# Patient Record
Sex: Female | Born: 1985
Health system: Southern US, Community
[De-identification: ages and names within clinical notes are randomized; demographics above are authoritative.]

## PROBLEM LIST (undated history)

## (undated) DIAGNOSIS — F329 Major depressive disorder, single episode, unspecified: Secondary | ICD-10-CM

## (undated) DIAGNOSIS — K219 Gastro-esophageal reflux disease without esophagitis: Secondary | ICD-10-CM

## (undated) DIAGNOSIS — E785 Hyperlipidemia, unspecified: Secondary | ICD-10-CM

## (undated) DIAGNOSIS — E669 Obesity, unspecified: Secondary | ICD-10-CM

## (undated) DIAGNOSIS — K589 Irritable bowel syndrome without diarrhea: Secondary | ICD-10-CM

## (undated) DIAGNOSIS — F32A Depression, unspecified: Secondary | ICD-10-CM

## (undated) DIAGNOSIS — D649 Anemia, unspecified: Secondary | ICD-10-CM

## (undated) DIAGNOSIS — F419 Anxiety disorder, unspecified: Secondary | ICD-10-CM

## (undated) DIAGNOSIS — R519 Headache, unspecified: Secondary | ICD-10-CM

## (undated) DIAGNOSIS — J189 Pneumonia, unspecified organism: Secondary | ICD-10-CM

## (undated) DIAGNOSIS — Z8619 Personal history of other infectious and parasitic diseases: Secondary | ICD-10-CM

## (undated) DIAGNOSIS — T7840XA Allergy, unspecified, initial encounter: Secondary | ICD-10-CM

## (undated) HISTORY — PX: HERNIA REPAIR: SHX51

## (undated) HISTORY — DX: Anemia, unspecified: D64.9

## (undated) HISTORY — DX: Personal history of other infectious and parasitic diseases: Z86.19

## (undated) HISTORY — DX: Irritable bowel syndrome, unspecified: K58.9

## (undated) HISTORY — DX: Anxiety disorder, unspecified: F41.9

## (undated) HISTORY — DX: Obesity, unspecified: E66.9

## (undated) HISTORY — DX: Hyperlipidemia, unspecified: E78.5

## (undated) HISTORY — DX: Allergy, unspecified, initial encounter: T78.40XA

## (undated) HISTORY — PX: NO PAST SURGERIES: SHX2092

## (undated) HISTORY — DX: Gastro-esophageal reflux disease without esophagitis: K21.9

---
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
## 2005-04-09 ENCOUNTER — Ambulatory Visit: Payer: Self-pay | Admitting: Family Medicine

## 2005-04-24 ENCOUNTER — Ambulatory Visit: Payer: Self-pay | Admitting: Family Medicine

## 2007-11-01 ENCOUNTER — Encounter: Admission: RE | Admit: 2007-11-01 | Discharge: 2007-11-01 | Payer: Self-pay | Admitting: Gastroenterology

## 2010-08-27 ENCOUNTER — Ambulatory Visit
Admission: RE | Admit: 2010-08-27 | Discharge: 2010-08-27 | Disposition: A | Discharge status: Home / Self Care | Payer: BC Managed Care – PPO | Source: Ambulatory Visit | Attending: Internal Medicine | Admitting: Internal Medicine

## 2010-08-27 ENCOUNTER — Other Ambulatory Visit: Payer: Self-pay | Admitting: Internal Medicine

## 2010-08-27 DIAGNOSIS — R52 Pain, unspecified: Secondary | ICD-10-CM

## 2011-12-09 ENCOUNTER — Encounter (INDEPENDENT_AMBULATORY_CARE_PROVIDER_SITE_OTHER): Payer: Self-pay

## 2011-12-11 ENCOUNTER — Encounter (INDEPENDENT_AMBULATORY_CARE_PROVIDER_SITE_OTHER): Payer: Self-pay | Admitting: Surgery

## 2011-12-11 ENCOUNTER — Ambulatory Visit (INDEPENDENT_AMBULATORY_CARE_PROVIDER_SITE_OTHER): Payer: PRIVATE HEALTH INSURANCE | Admitting: Surgery

## 2011-12-11 VITALS — BP 124/90 | HR 77 | Temp 98.0°F | Ht 66.0 in | Wt 187.8 lb

## 2011-12-11 DIAGNOSIS — E049 Nontoxic goiter, unspecified: Secondary | ICD-10-CM

## 2011-12-11 DIAGNOSIS — E040 Nontoxic diffuse goiter: Secondary | ICD-10-CM

## 2011-12-11 NOTE — Patient Instructions (Signed)
Thyroid Diseases Your thyroid is a butterfly-shaped gland in your neck. It is located just above your collarbone. It is one of your endocrine glands, which make hormones. The thyroid helps set your metabolism. Metabolism is how your body gets energy from the foods you eat.  Millions of people have thyroid diseases. Women experience thyroid problems more often than men. In fact, overactive thyroid problems (hyperthyroidism) occur in 1% of all women. If you have a thyroid disease, your body may use energy more slowly or quickly than it should.  Thyroid problems also include an immune disease where your body reacts against your thyroid gland (called thyroiditis). A different problem involves lumps and bumps (called nodules) that develop in the gland. The nodules are usually, but not always, noncancerous. THE MOST COMMON THYROID PROBLEMS AND CAUSES ARE DISCUSSED BELOW There are many causes for thyroid problems. Treatment depends upon the exact diagnosis and includes trying to reset your body's metabolism to a normal rate. Hyperthyroidism Too much thyroid hormone from an overactive thyroid gland is called hyperthyroidism. In hyperthyroidism, the body's metabolism speeds up. One of the most frequent forms of hyperthyroidism is known as Graves' disease. Graves' disease tends to run in families. Although Graves' is thought to be caused by a problem with the immune system, the exact nature of the genetic problem is unknown. Hypothyroidism Too little thyroid hormone from an underactive thyroid gland is called hypothyroidism. In hypothyroidism, the body's metabolism is slowed. Several things can cause this condition. Most causes affect the thyroid gland directly and hurt its ability to make enough hormone.  Rarely, there may be a pituitary gland tumor (located near the base of the brain). The tumor can block the pituitary from producing thyroid-stimulating hormone (TSH). Your body makes TSH to stimulate the thyroid  to work properly. If the pituitary does not make enough TSH, the thyroid fails to make enough hormones needed for good health. Whether the problem is caused by thyroid conditions or by the pituitary gland, the result is that the thyroid is not making enough hormones. Hypothyroidism causes many physical and mental processes to become sluggish. The body consumes less oxygen and produces less body heat. Thyroid Nodules A thyroid nodule is a small swelling or lump in the thyroid gland. They are common. These nodules represent either a growth of thyroid tissue or a fluid-filled cyst. Both form a lump in the thyroid gland. Almost half of all people will have tiny thyroid nodules at some point in their lives. Typically, these are not noticeable until they become large and affect normal thyroid size. Larger nodules that are greater than a half inch across (about 1 centimeter) occur in about 5 percent of people. Although most nodules are not cancerous, people who have them should seek medical care to rule out cancer. Also, some thyroid nodules may produce too much thyroid hormone or become too large. Large nodules or a large gland can interfere with breathing or swallowing or may cause neck discomfort. Other problems Other thyroid problems include cancer and thyroiditis. Thyroiditis is a malfunction of the body's immune system. Normally, the immune system works to defend the body against infection and other problems. When the immune system is not working properly, it may mistakenly attack normal cells, tissues, and organs. Examples of autoimmune diseases are Hashimoto's thyroiditis (which causes low thyroid function) and Graves' disease (which causes excess thyroid function). SYMPTOMS  Symptoms vary greatly depending upon the exact type of problem with the thyroid. Hyperthyroidism-is when your thyroid is too   active and makes more thyroid hormone than your body needs. The most common cause is Graves' Disease. Too  much thyroid hormone can cause some or all of the following symptoms:  Anxiety.   Irritability.   Difficulty sleeping.   Fatigue.   A rapid or irregular heartbeat.   A fine tremor of your hands or fingers.   An increase in perspiration.   Sensitivity to heat.   Weight loss, despite normal food intake.   Brittle hair.   Enlargement of your thyroid gland (goiter).   Light menstrual periods.   Frequent bowel movements.  Graves' disease can specifically cause eye and skin problems. The skin problems involve reddening and swelling of the skin, often on your shins and on the top of your feet. Eye problems can include the following:  Excess tearing and sensation of grit or sand in either or both eyes.   Reddened or inflamed eyes.   Widening of the space between your eyelids.   Swelling of the lids and tissues around the eyes.   Light sensitivity.   Ulcers on the cornea.   Double vision.   Limited eye movements.   Blurred or reduced vision.  Hypothyroidism- is when your thyroid gland is not active enough. This is more common than hyperthyroidism. Symptoms can vary a lot depending of the severity of the hormone deficiency. Symptoms may develop over a long period of time and can include several of the following:  Fatigue.   Sluggishness.   Increased sensitivity to cold.   Constipation.   Pale, dry skin.   A puffy face.   Hoarse voice.   High blood cholesterol level.   Unexplained weight gain.   Muscle aches, tenderness and stiffness.   Pain, stiffness or swelling in your joints.   Muscle weakness.   Heavier than normal menstrual periods.   Brittle fingernails and hair.   Depression.  Thyroid Nodules - most do not cause signs or symptoms. Occasionally, some may become so large that you can feel or even see the swelling at the base of your neck. You may realize a lump or swelling is there when you are shaving or putting on makeup. Men might become  aware of a nodule when shirt collars suddenly feel too tight. Some nodules produce too much thyroid hormone. This can produce the same symptoms as hyperthyroidism (see above). Thyroid nodules are seldom cancerous. However, a nodule is more likely to be malignant (cancerous) if it:  Grows quickly or feels hard.   Causes you to become hoarse or to have trouble swallowing or breathing.   Causes enlarged lymph nodes under your jaw or in your neck.  DIAGNOSIS  Because there are so many possible thyroid conditions, your caregiver may ask for a number of tests. They will do this in order to narrow down the exact diagnosis. These tests can include:  Blood and antibody tests.   Special thyroid scans using small, safe amounts of radioactive iodine.   Ultrasound of the thyroid gland (particularly if there is a nodule or lump).   Biopsy. This is usually done with a special needle. A needle biopsy is a procedure to obtain a sample of cells from the thyroid. The tissue will be tested in a lab and examined under a microscope.  TREATMENT  Treatment depends on the exact diagnosis. Hyperthyroidism  Beta-blockers help relieve many of the symptoms.   Anti-thyroid medications prevent the thyroid from making excess hormones.   Radioactive iodine treatment can destroy overactive thyroid   cells. The iodine can permanently decrease the amount of hormone produced.   Surgery to remove the thyroid gland.   Treatments for eye problems that come from Graves' disease also include medications and special eye surgery, if felt to be appropriate.  Hypothyroidism Thyroid replacement with levothyroxine is the mainstay of treatment. Treatment with thyroid replacement is usually lifelong and will require monitoring and adjustment from time to time. Thyroid Nodules  Watchful waiting. If a small nodule causes no symptoms or signs of cancer on biopsy, then no treatment may be chosen at first. Re-exam and re-checking blood  tests would be the recommended follow-up.   Anti-thyroid medications or radioactive iodine treatment may be recommended if the nodules produce too much thyroid hormone (see Treatment for Hyperthyroidism above).   Alcohol ablation. Injections of small amounts of ethyl alcohol (ethanol) can cause a non-cancerous nodule to shrink in size.   Surgery (see Treatment for Hyperthyroidism above).  HOME CARE INSTRUCTIONS   Take medications as instructed.   Follow through on recommended testing.  SEEK MEDICAL CARE IF:   You feel that you are developing symptoms of Hyperthyroidism or Hypothyroidism as described above.   You develop a new lump/nodule in the neck/thyroid area that you had not noticed before.   You feel that you are having side effects from medicines prescribed.   You develop trouble breathing or swallowing.  SEEK IMMEDIATE MEDICAL CARE IF:   You develop a fever of 102 F (38.9 C) or higher.   You develop severe sweating.   You develop palpitations and/or rapid heart beat.   You develop shortness of breath.   You develop nausea and vomiting.   You develop extreme shakiness.   You develop agitation.   You develop lightheadedness or have a fainting episode.  Document Released: 02/09/2007 Document Revised: 04/03/2011 Document Reviewed: 02/09/2007 [employer] Patient Information 2012 [employer]. 

## 2011-12-11 NOTE — Progress Notes (Signed)
General Surgery [facility]  Chief Complaint  Patient presents with  . 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 This was initially noted when the patient was in high school. She underwent an ultrasound in 2004 which showed a mildly enlarged thyroid gland without focal lesion.  Patient was evaluated last month by her gynecologist. She was again noted to have a slightly enlarged thyroid gland. Thyroid function tests were obtained and were normal with a TSH level of 1.377. Patient has not had any imaging studies since 2004.  Patient does note a history of goiter in the patient's maternal grandmother.  The patient's mother has hypothyroidism and takes medication. There is no family history of thyroid cancer. There is no family history of other endocrinopathy.  Past Medical History  Diagnosis Date  . Anemia   . GERD (gastroesophageal reflux disease)      Current Outpatient Prescriptions  Medication Sig Dispense Refill  . Multiple Vitamins-Minerals (MULTIVITAMIN WITH MINERALS) tablet Take 1 tablet by mouth daily.         No Known Allergies   Family History  Problem Relation Age of Onset  . Hyperlipidemia Mother   . Hypertension Mother   . Arthritis Mother   . Hyperlipidemia Father   . Hypertension Father   . Cancer Maternal Grandmother     melanoma  . Cancer Paternal Grandmother     liver     History   Social History  . Marital Status: Married    Spouse Name: N/A    Number of Children: N/A  . Years of Education: N/A   Social History Main Topics  . Smoking status: Never Smoker   . Smokeless tobacco: None  . Alcohol Use: Yes     social  . Drug Use: No  . Sexually Active:    Other Topics Concern  . None   Social History Narrative  . None     REVIEW OF SYSTEMS - PERTINENT POSITIVES  ONLY: Denies tremors. Denies palpitations. Denies compressive symptoms. No prior head or neck surgery.  EXAM: Filed Vitals:   12/11/11 1316  BP: 124/90  Pulse: 77  Temp: 98 F (36.7 C)    HEENT: normocephalic; pupils equal and reactive; sclerae clear; dentition good; mucous membranes moist NECK:  Slightly enlarged and mildly firm thyroid gland without palpable dominant or discrete nodule; no significant tenderness; symmetric on extension; no palpable anterior or posterior cervical lymphadenopathy; no supraclavicular masses; no tenderness CHEST: clear to auscultation bilaterally without rales, rhonchi, or wheezes CARDIAC: regular rate and rhythm without significant murmur; peripheral pulses are full EXT:  non-tender without edema; no deformity NEURO: no gross focal deficits; no sign of tremor   LABORATORY RESULTS: See [facility] ([facility]L-Epic) for most recent results   RADIOLOGY RESULTS: See [facility] (CHL-Epic) for most recent results   IMPRESSION: Small diffuse thyroid goiter  PLAN: The patient will be scheduled for thyroid ultrasound. I will review these results and contact her with the report. If there are no focal or worrisome findings, then I think she can simply be followed with annual physical examination and annual TSH levels. She likely has some low-grade thyroiditis and may eventually develop hypothyroidism. At this point there is no role for surgical intervention. I will review her ultrasound and contact her with my final recommendations at that time.  Velora Heckler, MD, FACS General & Endocrine Surgery [facility]   Visit Diagnoses: 1. Goiter diffuse, nontoxic     Primary Care Physician: Tomi Bamberger, NP

## 2011-12-15 ENCOUNTER — Other Ambulatory Visit: Payer: Self-pay

## 2011-12-17 ENCOUNTER — Ambulatory Visit
Admission: RE | Admit: 2011-12-17 | Discharge: 2011-12-17 | Disposition: A | Discharge status: Home / Self Care | Payer: PRIVATE HEALTH INSURANCE | Source: Ambulatory Visit | Attending: Surgery | Admitting: Surgery

## 2011-12-17 DIAGNOSIS — E040 Nontoxic diffuse goiter: Secondary | ICD-10-CM

## 2011-12-18 ENCOUNTER — Other Ambulatory Visit (INDEPENDENT_AMBULATORY_CARE_PROVIDER_SITE_OTHER): Payer: Self-pay

## 2011-12-18 ENCOUNTER — Telehealth (INDEPENDENT_AMBULATORY_CARE_PROVIDER_SITE_OTHER): Payer: Self-pay

## 2011-12-18 NOTE — Telephone Encounter (Signed)
Pt notified of ultrasound result.

## 2011-12-31 ENCOUNTER — Telehealth (INDEPENDENT_AMBULATORY_CARE_PROVIDER_SITE_OTHER): Payer: Self-pay | Admitting: Surgery

## 2011-12-31 DIAGNOSIS — E049 Nontoxic goiter, unspecified: Secondary | ICD-10-CM

## 2011-12-31 DIAGNOSIS — E040 Nontoxic diffuse goiter: Secondary | ICD-10-CM

## 2011-12-31 NOTE — Telephone Encounter (Signed)
Telephone call to the patient with ultrasound results. Thyroid gland is minimally enlarged. Tissue is homogeneous. No nodules. No cystic lesions.  Recommend annual physical examination by her primary care provider. Patient should also have a TSH level checked annually.  Patient will return for surgical evaluation as needed.  Velora Heckler, MD, [facility] Office: [phone]

## 2012-01-13 ENCOUNTER — Encounter (INDEPENDENT_AMBULATORY_CARE_PROVIDER_SITE_OTHER): Payer: Self-pay

## 2012-01-19 LAB — OB RESULTS CONSOLE ANTIBODY SCREEN: Antibody Screen: NEGATIVE

## 2012-01-19 LAB — OB RESULTS CONSOLE GC/CHLAMYDIA: Gonorrhea: NEGATIVE

## 2012-01-19 LAB — OB RESULTS CONSOLE ABO/RH

## 2012-01-19 LAB — OB RESULTS CONSOLE RUBELLA ANTIBODY, IGM: Rubella: IMMUNE

## 2012-08-27 ENCOUNTER — Encounter (HOSPITAL_COMMUNITY): Payer: Self-pay | Admitting: *Deleted

## 2012-08-27 ENCOUNTER — Telehealth (HOSPITAL_COMMUNITY): Payer: Self-pay | Admitting: *Deleted

## 2012-08-27 NOTE — Telephone Encounter (Signed)
Preadmission screen  

## 2012-08-28 ENCOUNTER — Inpatient Hospital Stay (HOSPITAL_COMMUNITY): Admission: AD | Admit: 2012-08-28 | Payer: Self-pay | Source: Ambulatory Visit | Admitting: Obstetrics & Gynecology

## 2012-08-31 ENCOUNTER — Inpatient Hospital Stay (HOSPITAL_COMMUNITY)
Admission: RE | Admit: 2012-08-31 | Discharge: 2012-09-03 | DRG: 775 | Disposition: A | Discharge status: Home / Self Care | Payer: PRIVATE HEALTH INSURANCE | Source: Ambulatory Visit | Attending: Obstetrics and Gynecology | Admitting: Obstetrics and Gynecology

## 2012-08-31 DIAGNOSIS — E040 Nontoxic diffuse goiter: Secondary | ICD-10-CM

## 2012-08-31 LAB — CBC
HCT: 35.7 % — ABNORMAL LOW (ref 36.0–46.0)
MCHC: 34.2 g/dL (ref 30.0–36.0)
MCV: 90.2 fL (ref 78.0–100.0)
RDW: 13.9 % (ref 11.5–15.5)

## 2012-08-31 MED ORDER — CITRIC ACID-SODIUM CITRATE 334-500 MG/5ML PO SOLN: 30.0000 mL | ORAL | Status: DC | PRN

## 2012-08-31 MED ORDER — OXYTOCIN BOLUS FROM INFUSION: 500.0000 mL | INTRAVENOUS | Status: DC

## 2012-08-31 MED ORDER — TERBUTALINE SULFATE 1 MG/ML IJ SOLN: 0.2500 mg | Freq: Once | INTRAMUSCULAR | Status: AC | PRN

## 2012-08-31 MED ORDER — ONDANSETRON HCL 4 MG/2ML IJ SOLN: 4.0000 mg | Freq: Four times a day (QID) | INTRAMUSCULAR | Status: DC | PRN

## 2012-08-31 MED ORDER — IBUPROFEN 600 MG PO TABS: 600.0000 mg | ORAL_TABLET | Freq: Four times a day (QID) | ORAL | Status: DC | PRN

## 2012-08-31 MED ORDER — LACTATED RINGERS IV SOLN
INTRAVENOUS | Status: DC
  Administered 2012-08-31 – 2012-09-01 (×3): via INTRAVENOUS

## 2012-08-31 MED ORDER — OXYTOCIN 40 UNITS IN LACTATED RINGERS INFUSION - SIMPLE MED
62.5000 mL/h | INTRAVENOUS | Status: DC
  Filled 2012-08-31: qty 1000

## 2012-08-31 MED ORDER — ZOLPIDEM TARTRATE 5 MG PO TABS
5.0000 mg | ORAL_TABLET | Freq: Every day | ORAL | Status: DC
  Administered 2012-08-31: 5 mg via ORAL
  Filled 2012-08-31: qty 1

## 2012-08-31 MED ORDER — LACTATED RINGERS IV SOLN: 500.0000 mL | INTRAVENOUS | Status: DC | PRN

## 2012-08-31 MED ORDER — ACETAMINOPHEN 325 MG PO TABS: 650.0000 mg | ORAL_TABLET | ORAL | Status: DC | PRN

## 2012-08-31 MED ORDER — LIDOCAINE HCL (PF) 1 % IJ SOLN
30.0000 mL | INTRAMUSCULAR | Status: DC | PRN
  Filled 2012-08-31 (×2): qty 30

## 2012-08-31 MED ORDER — OXYCODONE-ACETAMINOPHEN 5-325 MG PO TABS: 1.0000 | ORAL_TABLET | ORAL | Status: DC | PRN

## 2012-08-31 MED ORDER — MISOPROSTOL 25 MCG QUARTER TABLET
25.0000 ug | ORAL_TABLET | ORAL | Status: DC | PRN
  Administered 2012-08-31 – 2012-09-01 (×2): 25 ug via VAGINAL
  Filled 2012-08-31: qty 1
  Filled 2012-08-31 (×2): qty 0.25

## 2012-09-01 ENCOUNTER — Encounter (HOSPITAL_COMMUNITY): Payer: Self-pay | Admitting: Anesthesiology

## 2012-09-01 ENCOUNTER — Encounter (HOSPITAL_COMMUNITY): Payer: Self-pay

## 2012-09-01 ENCOUNTER — Inpatient Hospital Stay (HOSPITAL_COMMUNITY): Payer: PRIVATE HEALTH INSURANCE | Admitting: Anesthesiology

## 2012-09-01 MED ORDER — TETANUS-DIPHTH-ACELL PERTUSSIS 5-2.5-18.5 LF-MCG/0.5 IM SUSP: 0.5000 mL | Freq: Once | INTRAMUSCULAR | Status: DC

## 2012-09-01 MED ORDER — IBUPROFEN 600 MG PO TABS
600.0000 mg | ORAL_TABLET | Freq: Four times a day (QID) | ORAL | Status: DC
  Administered 2012-09-01 – 2012-09-03 (×7): 600 mg via ORAL
  Filled 2012-09-01 (×7): qty 1

## 2012-09-01 MED ORDER — PHENYLEPHRINE 40 MCG/ML (10ML) SYRINGE FOR IV PUSH (FOR BLOOD PRESSURE SUPPORT)
80.0000 ug | PREFILLED_SYRINGE | INTRAVENOUS | Status: DC | PRN
  Filled 2012-09-01: qty 2

## 2012-09-01 MED ORDER — FLEET ENEMA 7-19 GM/118ML RE ENEM: 1.0000 | ENEMA | Freq: Every day | RECTAL | Status: DC | PRN

## 2012-09-01 MED ORDER — BUTORPHANOL TARTRATE 1 MG/ML IJ SOLN
1.0000 mg | Freq: Once | INTRAMUSCULAR | Status: AC
  Administered 2012-09-01: 1 mg via INTRAVENOUS
  Filled 2012-09-01: qty 1

## 2012-09-01 MED ORDER — ZOLPIDEM TARTRATE 5 MG PO TABS: 5.0000 mg | ORAL_TABLET | Freq: Every evening | ORAL | Status: DC | PRN

## 2012-09-01 MED ORDER — LIDOCAINE HCL (PF) 1 % IJ SOLN
INTRAMUSCULAR | Status: DC | PRN
  Administered 2012-09-01 (×2): 5 mL

## 2012-09-01 MED ORDER — EPHEDRINE 5 MG/ML INJ
10.0000 mg | INTRAVENOUS | Status: DC | PRN
  Filled 2012-09-01: qty 4
  Filled 2012-09-01: qty 2

## 2012-09-01 MED ORDER — EPHEDRINE 5 MG/ML INJ
10.0000 mg | INTRAVENOUS | Status: DC | PRN
Start: 2012-09-01 — End: 2012-09-01
  Filled 2012-09-01: qty 2

## 2012-09-01 MED ORDER — PROMETHAZINE HCL 25 MG/ML IJ SOLN
12.5000 mg | Freq: Four times a day (QID) | INTRAMUSCULAR | Status: DC | PRN
  Administered 2012-09-01: 12.5 mg via INTRAVENOUS
  Filled 2012-09-01: qty 1

## 2012-09-01 MED ORDER — FENTANYL 2.5 MCG/ML BUPIVACAINE 1/10 % EPIDURAL INFUSION (WH - ANES)
14.0000 mL/h | INTRAMUSCULAR | Status: DC | PRN
  Administered 2012-09-01: 14 mL/h via EPIDURAL
  Filled 2012-09-01: qty 125

## 2012-09-01 MED ORDER — BISACODYL 10 MG RE SUPP: 10.0000 mg | Freq: Every day | RECTAL | Status: DC | PRN

## 2012-09-01 MED ORDER — SENNOSIDES-DOCUSATE SODIUM 8.6-50 MG PO TABS
2.0000 | ORAL_TABLET | Freq: Every day | ORAL | Status: DC
Start: 2012-09-01 — End: 2012-09-03
  Administered 2012-09-01 – 2012-09-02 (×2): 2 via ORAL

## 2012-09-01 MED ORDER — LANOLIN HYDROUS EX OINT: TOPICAL_OINTMENT | CUTANEOUS | Status: DC | PRN

## 2012-09-01 MED ORDER — OXYCODONE-ACETAMINOPHEN 5-325 MG PO TABS: 1.0000 | ORAL_TABLET | ORAL | Status: DC | PRN

## 2012-09-01 MED ORDER — BENZOCAINE-MENTHOL 20-0.5 % EX AERO
1.0000 "application " | INHALATION_SPRAY | CUTANEOUS | Status: DC | PRN
  Administered 2012-09-01: 1 via TOPICAL
  Filled 2012-09-01: qty 56

## 2012-09-01 MED ORDER — DIPHENHYDRAMINE HCL 50 MG/ML IJ SOLN: 12.5000 mg | INTRAMUSCULAR | Status: DC | PRN

## 2012-09-01 MED ORDER — SIMETHICONE 80 MG PO CHEW: 80.0000 mg | CHEWABLE_TABLET | ORAL | Status: DC | PRN

## 2012-09-01 MED ORDER — WITCH HAZEL-GLYCERIN EX PADS: 1.0000 "application " | MEDICATED_PAD | CUTANEOUS | Status: DC | PRN

## 2012-09-01 MED ORDER — ONDANSETRON HCL 4 MG PO TABS: 4.0000 mg | ORAL_TABLET | ORAL | Status: DC | PRN

## 2012-09-01 MED ORDER — DIBUCAINE 1 % RE OINT: 1.0000 "application " | TOPICAL_OINTMENT | RECTAL | Status: DC | PRN

## 2012-09-01 MED ORDER — ONDANSETRON HCL 4 MG/2ML IJ SOLN: 4.0000 mg | INTRAMUSCULAR | Status: DC | PRN

## 2012-09-01 MED ORDER — DIPHENHYDRAMINE HCL 25 MG PO CAPS: 25.0000 mg | ORAL_CAPSULE | Freq: Four times a day (QID) | ORAL | Status: DC | PRN

## 2012-09-01 MED ORDER — PRENATAL MULTIVITAMIN CH
1.0000 | ORAL_TABLET | Freq: Every day | ORAL | Status: DC
  Administered 2012-09-02: 1 via ORAL
  Filled 2012-09-01: qty 1

## 2012-09-01 MED ORDER — PHENYLEPHRINE 40 MCG/ML (10ML) SYRINGE FOR IV PUSH (FOR BLOOD PRESSURE SUPPORT)
80.0000 ug | PREFILLED_SYRINGE | INTRAVENOUS | Status: DC | PRN
  Filled 2012-09-01: qty 5
  Filled 2012-09-01: qty 2

## 2012-09-01 MED ORDER — LACTATED RINGERS IV SOLN
500.0000 mL | Freq: Once | INTRAVENOUS | Status: AC
  Administered 2012-09-01: 500 mL via INTRAVENOUS

## 2012-09-01 MED ORDER — OXYTOCIN 40 UNITS IN LACTATED RINGERS INFUSION - SIMPLE MED
1.0000 m[IU]/min | INTRAVENOUS | Status: DC
  Administered 2012-09-01: 2 m[IU]/min via INTRAVENOUS

## 2012-09-01 NOTE — Anesthesia Preprocedure Evaluation (Signed)
Anesthesia Evaluation  Patient identified by MRN, date of birth, ID band Patient awake    Reviewed: Allergy & Precautions, H&P , Patient's Chart, lab work & pertinent test results  Airway Mallampati: II TM Distance: >3 FB Neck ROM: full    Dental no notable dental hx.    Pulmonary neg pulmonary ROS,  breath sounds clear to auscultation  Pulmonary exam normal       Cardiovascular negative cardio ROS  Rhythm:regular Rate:Normal     Neuro/Psych  Headaches, Anxiety negative neurological ROS  negative psych ROS   GI/Hepatic negative GI ROS, Neg liver ROS, GERD-  ,  Endo/Other  negative endocrine ROS  Renal/GU negative Renal ROS     Musculoskeletal   Abdominal   Peds  Hematology negative hematology ROS (+) anemia ,   Anesthesia Other Findings Anemia     GERD (gastroesophageal reflux disease)        Hx of varicella     History of shingles        Headache     Anxiety        Enlarged thyroid    Reproductive/Obstetrics (+) Pregnancy                           Anesthesia Physical Anesthesia Plan  ASA: II  Anesthesia Plan: Epidural   Post-op Pain Management:    Induction:   Airway Management Planned:   Additional Equipment:   Intra-op Plan:   Post-operative Plan:   Informed Consent: I have reviewed the patients History and Physical, chart, labs and discussed the procedure including the risks, benefits and alternatives for the proposed anesthesia with the patient or authorized representative who has indicated his/her understanding and acceptance.     Plan Discussed with:   Anesthesia Plan Comments:         Anesthesia Quick Evaluation

## 2012-09-01 NOTE — Anesthesia Procedure Notes (Signed)
Epidural Patient location during procedure: OB Start time: 09/01/2012 7:21 AM  Staffing Anesthesiologist: Angus Seller., Harrell Gave. Performed by: anesthesiologist   Preanesthetic Checklist Completed: patient identified, site marked, surgical consent, pre-op evaluation, timeout performed, IV checked, risks and benefits discussed and monitors and equipment checked  Epidural Patient position: sitting Prep: site prepped and draped and DuraPrep Patient monitoring: continuous pulse ox and blood pressure Approach: midline Injection technique: LOR air and LOR saline  Needle:  Needle type: Tuohy  Needle gauge: 17 G Needle length: 9 cm and 9 Needle insertion depth: 5 cm cm Catheter type: closed end flexible Catheter size: 19 Gauge Catheter at skin depth: 10 cm Test dose: negative  Assessment Events: blood not aspirated, injection not painful, no injection resistance, negative IV test and no paresthesia  Additional Notes Patient identified.  Risk benefits discussed including failed block, incomplete pain control, headache, nerve damage, paralysis, blood pressure changes, nausea, vomiting, reactions to medication both toxic or allergic, and postpartum back pain.  Patient expressed understanding and wished to proceed.  All questions were answered.  Sterile technique used throughout procedure and epidural site dressed with sterile barrier dressing. No paresthesia or other complications noted.The patient did not experience any signs of intravascular injection such as tinnitus or metallic taste in mouth nor signs of intrathecal spread such as rapid motor block. Please see nursing notes for vital signs.

## 2012-09-01 NOTE — Progress Notes (Signed)
Delivery Note At 12:17 PM a viable female was delivered via Vaginal, Spontaneous Delivery (Presentation: Left Occiput Anterior).  APGAR: 9, 9; weight .   Placenta status: Intact, Spontaneous.  Cord: 3 vessels with the following complications: None.  Cord pH: pending  Anesthesia: Epidural  Episiotomy: Median Lacerations: second degree posterior ML lac repaired and first degree anterior midline lac not repaired Suture Repair: vicryl rapide Est. Blood Loss (mL): 300  Mom to postpartum.  Baby to nursery-stable.  Robin Little,Robin Little E 09/01/2012, 12:40 PM

## 2012-09-01 NOTE — H&P (Signed)
Robin Little is a 27 y.o. female presenting for IOL last pm. S/P cytotec. SROM for clear fluid about 3:30 am. Now epidural in. Maternal Medical History:  Fetal activity: Perceived fetal activity is normal.      OB History   Grav Para Term Preterm Abortions TAB SAB Ect Mult Living   1              Past Medical History  Diagnosis Date  . Anemia   . GERD (gastroesophageal reflux disease)   . Hx of varicella   . History of shingles   . Headache   . Anxiety   . Enlarged thyroid    Past Surgical History  Procedure Laterality Date  . No past surgeries     Family History: family history includes Alcohol abuse in her paternal grandfather; Arthritis in her mother; Cancer in her maternal grandmother and paternal grandmother; Diabetes in her maternal grandfather; Goiter in her maternal grandmother; Hyperlipidemia in her father and mother; Hypertension in her father and mother; Hypothyroidism in her mother; and Parkinson's disease in her maternal grandfather. Social History:  reports that she has never smoked. She has never used smokeless tobacco. She reports that  drinks alcohol. She reports that she does not use illicit drugs.   Prenatal Transfer Tool  Maternal Diabetes: No Genetic Screening: Normal Maternal Ultrasounds/Referrals: Normal Fetal Ultrasounds or other Referrals:  None Maternal Substance Abuse:  No Significant Maternal Medications:  None Significant Maternal Lab Results:  None Other Comments:  None  Review of Systems  Eyes: Negative for blurred vision.  Gastrointestinal: Negative for abdominal pain.  Neurological: Negative for headaches.    Dilation: 3 Effacement (%): 60 Station: -1 Exam by:: Lucas Mallow, RN Blood pressure 126/113, pulse 74, temperature 98.5 F (36.9 C), temperature source Oral, resp. rate 18, height 5\' 6"  (1.676 m), weight 200 lb (90.719 kg), last menstrual period 11/22/2011. Maternal Exam:  Uterine Assessment: Contraction strength is firm.   Contraction frequency is regular.   Abdomen: Patient reports no abdominal tenderness. Fetal presentation: vertex     Fetal Exam Fetal Monitor Review: Pattern: accelerations present.       Physical Exam  Cardiovascular: Normal rate and regular rhythm.   Respiratory: Effort normal and breath sounds normal.  Neurological: She has normal reflexes.   Cx  3/C/-1/vtx Prenatal labs: ABO, Rh: A/Positive/-- (09/23 0000) Antibody: Negative (09/23 0000) Rubella: Immune (09/23 0000) RPR: NON REACTIVE (05/06 2010)  HBsAg: Negative (09/23 0000)  HIV: Non-reactive (09/23 0000)  GBS: Negative (04/03 0000)   Assessment/Plan: 27 yo G1P0 at 40 4/7 weeks In active labor. Pitocin running D/W patient and husband  Robin Little,Robin Little 09/01/2012, 8:28 AM

## 2012-09-02 LAB — CBC
Platelets: 124 10*3/uL — ABNORMAL LOW (ref 150–400)
RDW: 14 % (ref 11.5–15.5)
WBC: 11.2 10*3/uL — ABNORMAL HIGH (ref 4.0–10.5)

## 2012-09-02 NOTE — Anesthesia Postprocedure Evaluation (Signed)
  Anesthesia Post-op Note  Patient: Robin Little  Procedure(s) Performed: * No procedures listed *  Patient Location: Mother/Baby  Anesthesia Type:Epidural  Level of Consciousness: awake  Airway and Oxygen Therapy: Patient Spontanous Breathing  Post-op Pain: none  Post-op Assessment: Patient's Cardiovascular Status Stable, Respiratory Function Stable, Patent Airway, No signs of Nausea or vomiting, Adequate PO intake, Pain level controlled, No headache, No backache, No residual numbness and No residual motor weakness  Post-op Vital Signs: Reviewed and stable  Complications: No apparent anesthesia complications

## 2012-09-02 NOTE — Progress Notes (Signed)
Post Partum Day 1 Subjective: no complaints and up ad lib  Objective: Blood pressure 96/61, pulse 75, temperature 97.9 F (36.6 C), temperature source Oral, resp. rate 20, height 5\' 6"  (1.676 m), weight 90.719 kg (200 lb), last menstrual period 11/22/2011, SpO2 98.00%, unknown if currently breastfeeding.  Physical Exam:  General: alert, cooperative and appears stated age Lochia: appropriate Uterine Fundus: firm Incision: healing well, no significant drainage, no dehiscence, no significant erythema DVT Evaluation: No evidence of DVT seen on physical exam.   Recent Labs  08/31/12 2010 09/02/12 0630  HGB 12.2 9.1*  HCT 35.7* 27.7*    Assessment/Plan: Plan for discharge tomorrow and Circumcision prior to discharge   LOS: 2 days   Derry Arbogast L 09/02/2012, 7:59 AM

## 2012-09-03 MED ORDER — OXYCODONE-ACETAMINOPHEN 5-325 MG PO TABS: 1.0000 | ORAL_TABLET | ORAL | Status: DC | PRN

## 2012-09-03 MED ORDER — IBUPROFEN 600 MG PO TABS: 600.0000 mg | ORAL_TABLET | Freq: Four times a day (QID) | ORAL | Status: DC

## 2012-09-03 NOTE — Discharge Summary (Signed)
Obstetric Discharge Summary Reason for Admission: induction of labor Prenatal Procedures: ultrasound Intrapartum Procedures: spontaneous vaginal delivery Postpartum Procedures: none Complications-Operative and Postpartum: 2 degree perineal laceration Hemoglobin  Date Value Range Status  09/02/2012 9.1* 12.0 - 15.0 g/dL Final     REPEATED TO VERIFY     DELTA CHECK NOTED     HCT  Date Value Range Status  09/02/2012 27.7* 36.0 - 46.0 % Final    Physical Exam:  General: alert and cooperative Lochia: appropriate Uterine Fundus: firm Incision: perineum intact DVT Evaluation: No evidence of DVT seen on physical exam. Negative Homan's sign. No cords or calf tenderness. No significant calf/ankle edema.  Discharge Diagnoses: Term Pregnancy-delivered  Discharge Information: Date: 09/03/2012 Activity: pelvic rest Diet: routine Medications: PNV, Ibuprofen and Percocet Condition: stable Instructions: refer to practice specific booklet Discharge to: home   Newborn Data: Live born female  Birth Weight: 7 lb 5 oz (3317 g) APGAR: 9, 9  Home with mother.  CURTIS,CAROL G 09/03/2012, 8:34 AM

## 2012-09-03 NOTE — Progress Notes (Signed)
Post discharge chart review completed.  

## 2012-09-03 NOTE — Lactation Note (Signed)
This note was copied from the chart of Robin Grant Henkes. Lactation Consultation Note Baby just finishing up a feeding on the left when I enter room; mom holding baby in football; baby sleeping soundly.  Mom states br feeding is going very well; denies pain. Mom and dad have numerous questions; discussed all their br feeding questions and reassured them that they are getting off to a very good start. Enc mom to call the lactation office for assistance if she has any concerns, and to attend the BFSG. Patient Name: Robin Little [id number] Date: 09/03/2012 Reason for consult: Follow-up assessment   Maternal Data    Feeding Feeding Type: Breast Milk Feeding method: Breast Length of feed: 60 min  LATCH Score/Interventions                      Lactation Tools Discussed/Used     Consult Status Consult Status: Complete    Lenard Forth 09/03/2012, 9:19 AM

## 2012-09-03 NOTE — Progress Notes (Signed)

## 2014-02-27 ENCOUNTER — Encounter (HOSPITAL_COMMUNITY): Payer: Self-pay

## 2014-02-28 LAB — OB RESULTS CONSOLE RUBELLA ANTIBODY, IGM: Rubella: IMMUNE

## 2014-02-28 LAB — OB RESULTS CONSOLE ABO/RH: RH TYPE: POSITIVE

## 2014-02-28 LAB — OB RESULTS CONSOLE HEPATITIS B SURFACE ANTIGEN: HEP B S AG: NEGATIVE

## 2014-02-28 LAB — OB RESULTS CONSOLE GC/CHLAMYDIA
Chlamydia: NEGATIVE
GC PROBE AMP, GENITAL: NEGATIVE

## 2014-02-28 LAB — OB RESULTS CONSOLE RPR: RPR: NONREACTIVE

## 2014-02-28 LAB — OB RESULTS CONSOLE ANTIBODY SCREEN: ANTIBODY SCREEN: NEGATIVE

## 2014-02-28 LAB — OB RESULTS CONSOLE HIV ANTIBODY (ROUTINE TESTING): HIV: NONREACTIVE

## 2014-04-28 ENCOUNTER — Inpatient Hospital Stay (HOSPITAL_COMMUNITY): Admission: AD | Admit: 2014-04-28 | Payer: 59 | Source: Ambulatory Visit | Admitting: Obstetrics & Gynecology

## 2014-04-28 NOTE — L&D Delivery Note (Signed)
Delivery Note At 2:31 PM a viable female was delivered via OP Presentation Apgars 9 9    Placenta status: spontaneously with 3 vessel cord and intact, .  Cord:  with the following complications:none .  Cord pH: not obtained  Anesthesia: Epidural  Episiotomy: None Lacerations: 2nd degree;Perineal Suture Repair: 3.0 chromic Est. Blood Loss (mL):  300  Mom to postpartum.  Baby to Couplet care / Skin to Skin.  Lawsyn Heiler L 10/10/2014, 2:42 PM

## 2014-10-06 ENCOUNTER — Encounter (HOSPITAL_COMMUNITY): Payer: Self-pay | Admitting: *Deleted

## 2014-10-06 ENCOUNTER — Telehealth (HOSPITAL_COMMUNITY): Payer: Self-pay | Admitting: *Deleted

## 2014-10-06 LAB — OB RESULTS CONSOLE GBS: STREP GROUP B AG: NEGATIVE

## 2014-10-06 NOTE — Telephone Encounter (Signed)
Preadmission screen  

## 2014-10-10 ENCOUNTER — Inpatient Hospital Stay (HOSPITAL_COMMUNITY): Payer: 59 | Admitting: Anesthesiology

## 2014-10-10 ENCOUNTER — Inpatient Hospital Stay (HOSPITAL_COMMUNITY)
Admission: RE | Admit: 2014-10-10 | Discharge: 2014-10-12 | DRG: 775 | Disposition: A | Discharge status: Home / Self Care | Payer: 59 | Source: Ambulatory Visit | Attending: Obstetrics and Gynecology | Admitting: Obstetrics and Gynecology

## 2014-10-10 ENCOUNTER — Encounter (HOSPITAL_COMMUNITY): Payer: Self-pay

## 2014-10-10 DIAGNOSIS — O9912 Other diseases of the blood and blood-forming organs and certain disorders involving the immune mechanism complicating childbirth: Secondary | ICD-10-CM | POA: Diagnosis present

## 2014-10-10 DIAGNOSIS — Z3A39 39 weeks gestation of pregnancy: Secondary | ICD-10-CM | POA: Diagnosis present

## 2014-10-10 DIAGNOSIS — Z3490 Encounter for supervision of normal pregnancy, unspecified, unspecified trimester: Secondary | ICD-10-CM

## 2014-10-10 DIAGNOSIS — Z3483 Encounter for supervision of other normal pregnancy, third trimester: Secondary | ICD-10-CM | POA: Diagnosis present

## 2014-10-10 DIAGNOSIS — D696 Thrombocytopenia, unspecified: Secondary | ICD-10-CM | POA: Diagnosis present

## 2014-10-10 LAB — CBC
HCT: 34.7 % — ABNORMAL LOW (ref 36.0–46.0)
HCT: 32.6 % — ABNORMAL LOW (ref 36.0–46.0)
HEMATOCRIT: 34.5 % — AB (ref 36.0–46.0)
HEMOGLOBIN: 11.4 g/dL — AB (ref 12.0–15.0)
Hemoglobin: 11.7 g/dL — ABNORMAL LOW (ref 12.0–15.0)
Hemoglobin: 10.9 g/dL — ABNORMAL LOW (ref 12.0–15.0)
MCH: 31.6 pg (ref 26.0–34.0)
MCH: 31.5 pg (ref 26.0–34.0)
MCH: 31.2 pg (ref 26.0–34.0)
MCHC: 33.7 g/dL (ref 30.0–36.0)
MCHC: 33.4 g/dL (ref 30.0–36.0)
MCHC: 33 g/dL (ref 30.0–36.0)
MCV: 93.8 fL (ref 78.0–100.0)
MCV: 94.2 fL (ref 78.0–100.0)
MCV: 94.5 fL (ref 78.0–100.0)
Platelets: 121 10*3/uL — ABNORMAL LOW (ref 150–400)
Platelets: 99 10*3/uL — ABNORMAL LOW (ref 150–400)
Platelets: 97 10*3/uL — ABNORMAL LOW (ref 150–400)
RBC: 3.7 MIL/uL — ABNORMAL LOW (ref 3.87–5.11)
RBC: 3.46 MIL/uL — ABNORMAL LOW (ref 3.87–5.11)
RBC: 3.65 MIL/uL — ABNORMAL LOW (ref 3.87–5.11)
RDW: 14.1 % (ref 11.5–15.5)
RDW: 14.3 % (ref 11.5–15.5)
RDW: 14.2 % (ref 11.5–15.5)
WBC: 9 10*3/uL (ref 4.0–10.5)
WBC: 9.2 10*3/uL (ref 4.0–10.5)
WBC: 12 10*3/uL — ABNORMAL HIGH (ref 4.0–10.5)

## 2014-10-10 LAB — RPR: RPR Ser Ql: NONREACTIVE

## 2014-10-10 LAB — ABO/RH: ABO/RH(D): A POS

## 2014-10-10 LAB — TYPE AND SCREEN
ABO/RH(D): A POS
Antibody Screen: NEGATIVE

## 2014-10-10 MED ORDER — FLEET ENEMA 7-19 GM/118ML RE ENEM: 1.0000 | ENEMA | RECTAL | Status: DC | PRN

## 2014-10-10 MED ORDER — PRENATAL MULTIVITAMIN CH
1.0000 | ORAL_TABLET | Freq: Every day | ORAL | Status: DC
  Administered 2014-10-11: 1 via ORAL
  Filled 2014-10-10: qty 1

## 2014-10-10 MED ORDER — IBUPROFEN 600 MG PO TABS
600.0000 mg | ORAL_TABLET | Freq: Four times a day (QID) | ORAL | Status: DC
  Administered 2014-10-11 – 2014-10-12 (×4): 600 mg via ORAL
  Filled 2014-10-10 (×4): qty 1

## 2014-10-10 MED ORDER — ONDANSETRON HCL 4 MG/2ML IJ SOLN: 4.0000 mg | INTRAMUSCULAR | Status: DC | PRN

## 2014-10-10 MED ORDER — ONDANSETRON HCL 4 MG PO TABS: 4.0000 mg | ORAL_TABLET | ORAL | Status: DC | PRN

## 2014-10-10 MED ORDER — LIDOCAINE HCL (PF) 1 % IJ SOLN
INTRAMUSCULAR | Status: DC | PRN
  Administered 2014-10-10 (×2): 4 mL

## 2014-10-10 MED ORDER — OXYCODONE-ACETAMINOPHEN 5-325 MG PO TABS
1.0000 | ORAL_TABLET | ORAL | Status: DC | PRN
  Administered 2014-10-10 – 2014-10-11 (×2): 1 via ORAL
  Filled 2014-10-10 (×2): qty 1

## 2014-10-10 MED ORDER — OXYCODONE-ACETAMINOPHEN 5-325 MG PO TABS: 1.0000 | ORAL_TABLET | ORAL | Status: DC | PRN

## 2014-10-10 MED ORDER — WITCH HAZEL-GLYCERIN EX PADS
1.0000 "application " | MEDICATED_PAD | CUTANEOUS | Status: DC | PRN
  Administered 2014-10-10: 1 via TOPICAL

## 2014-10-10 MED ORDER — LACTATED RINGERS IV SOLN: 500.0000 mL | INTRAVENOUS | Status: DC | PRN

## 2014-10-10 MED ORDER — ACETAMINOPHEN 325 MG PO TABS: 650.0000 mg | ORAL_TABLET | ORAL | Status: DC | PRN

## 2014-10-10 MED ORDER — OXYTOCIN 40 UNITS IN LACTATED RINGERS INFUSION - SIMPLE MED
62.5000 mL/h | INTRAVENOUS | Status: DC
  Filled 2014-10-10: qty 1000

## 2014-10-10 MED ORDER — BENZOCAINE-MENTHOL 20-0.5 % EX AERO
1.0000 "application " | INHALATION_SPRAY | CUTANEOUS | Status: DC | PRN
  Administered 2014-10-10: 1 via TOPICAL
  Filled 2014-10-10: qty 56

## 2014-10-10 MED ORDER — TERBUTALINE SULFATE 1 MG/ML IJ SOLN
0.2500 mg | Freq: Once | INTRAMUSCULAR | Status: DC | PRN
  Filled 2014-10-10: qty 1

## 2014-10-10 MED ORDER — SENNOSIDES-DOCUSATE SODIUM 8.6-50 MG PO TABS
2.0000 | ORAL_TABLET | ORAL | Status: DC
  Administered 2014-10-10 – 2014-10-12 (×2): 2 via ORAL
  Filled 2014-10-10 (×2): qty 2

## 2014-10-10 MED ORDER — PHENYLEPHRINE 40 MCG/ML (10ML) SYRINGE FOR IV PUSH (FOR BLOOD PRESSURE SUPPORT)
80.0000 ug | PREFILLED_SYRINGE | INTRAVENOUS | Status: DC | PRN
  Filled 2014-10-10: qty 20
  Filled 2014-10-10: qty 2

## 2014-10-10 MED ORDER — MEASLES, MUMPS & RUBELLA VAC ~~LOC~~ INJ: 0.5000 mL | INJECTION | Freq: Once | SUBCUTANEOUS | Status: DC

## 2014-10-10 MED ORDER — SIMETHICONE 80 MG PO CHEW: 80.0000 mg | CHEWABLE_TABLET | ORAL | Status: DC | PRN

## 2014-10-10 MED ORDER — DIPHENHYDRAMINE HCL 25 MG PO CAPS: 25.0000 mg | ORAL_CAPSULE | Freq: Four times a day (QID) | ORAL | Status: DC | PRN

## 2014-10-10 MED ORDER — OXYCODONE-ACETAMINOPHEN 5-325 MG PO TABS: 2.0000 | ORAL_TABLET | ORAL | Status: DC | PRN

## 2014-10-10 MED ORDER — EPHEDRINE 5 MG/ML INJ
10.0000 mg | INTRAVENOUS | Status: DC | PRN
  Filled 2014-10-10: qty 2

## 2014-10-10 MED ORDER — MEDROXYPROGESTERONE ACETATE 150 MG/ML IM SUSP: 150.0000 mg | INTRAMUSCULAR | Status: DC | PRN

## 2014-10-10 MED ORDER — DIPHENHYDRAMINE HCL 50 MG/ML IJ SOLN: 12.5000 mg | INTRAMUSCULAR | Status: DC | PRN

## 2014-10-10 MED ORDER — LANOLIN HYDROUS EX OINT: TOPICAL_OINTMENT | CUTANEOUS | Status: DC | PRN

## 2014-10-10 MED ORDER — TETANUS-DIPHTH-ACELL PERTUSSIS 5-2.5-18.5 LF-MCG/0.5 IM SUSP: 0.5000 mL | Freq: Once | INTRAMUSCULAR | Status: DC

## 2014-10-10 MED ORDER — FLEET ENEMA 7-19 GM/118ML RE ENEM: 1.0000 | ENEMA | Freq: Every day | RECTAL | Status: DC | PRN

## 2014-10-10 MED ORDER — FENTANYL 2.5 MCG/ML BUPIVACAINE 1/10 % EPIDURAL INFUSION (WH - ANES)
14.0000 mL/h | INTRAMUSCULAR | Status: DC | PRN
  Administered 2014-10-10: 14 mL/h via EPIDURAL
  Filled 2014-10-10: qty 125

## 2014-10-10 MED ORDER — BISACODYL 10 MG RE SUPP: 10.0000 mg | Freq: Every day | RECTAL | Status: DC | PRN

## 2014-10-10 MED ORDER — DIBUCAINE 1 % RE OINT
1.0000 "application " | TOPICAL_OINTMENT | RECTAL | Status: DC | PRN
  Administered 2014-10-10: 1 via RECTAL
  Filled 2014-10-10: qty 28

## 2014-10-10 MED ORDER — OXYTOCIN 40 UNITS IN LACTATED RINGERS INFUSION - SIMPLE MED
1.0000 m[IU]/min | INTRAVENOUS | Status: DC
  Administered 2014-10-10: 2 m[IU]/min via INTRAVENOUS

## 2014-10-10 MED ORDER — CITRIC ACID-SODIUM CITRATE 334-500 MG/5ML PO SOLN: 30.0000 mL | ORAL | Status: DC | PRN

## 2014-10-10 MED ORDER — LACTATED RINGERS IV SOLN
INTRAVENOUS | Status: DC
  Administered 2014-10-10 (×3): via INTRAVENOUS

## 2014-10-10 MED ORDER — OXYTOCIN BOLUS FROM INFUSION: 500.0000 mL | INTRAVENOUS | Status: DC

## 2014-10-10 MED ORDER — ZOLPIDEM TARTRATE 5 MG PO TABS: 5.0000 mg | ORAL_TABLET | Freq: Every evening | ORAL | Status: DC | PRN

## 2014-10-10 MED ORDER — LIDOCAINE HCL (PF) 1 % IJ SOLN
30.0000 mL | INTRAMUSCULAR | Status: DC | PRN
  Filled 2014-10-10: qty 30

## 2014-10-10 MED ORDER — ONDANSETRON HCL 4 MG/2ML IJ SOLN: 4.0000 mg | Freq: Four times a day (QID) | INTRAMUSCULAR | Status: DC | PRN

## 2014-10-10 NOTE — Anesthesia Preprocedure Evaluation (Signed)
Anesthesia Evaluation  Patient identified by MRN, date of birth, ID band Patient awake    Reviewed: Allergy & Precautions, NPO status , Patient's Chart, lab work & pertinent test results  History of Anesthesia Complications (+) history of anesthetic complications  Airway Mallampati: II  TM Distance: >3 FB Neck ROM: Full    Dental no notable dental hx. (+) Dental Advisory Given   Pulmonary neg pulmonary ROS,  breath sounds clear to auscultation  Pulmonary exam normal       Cardiovascular negative cardio ROS Normal cardiovascular examRhythm:Regular Rate:Normal     Neuro/Psych negative neurological ROS  negative psych ROS   GI/Hepatic Neg liver ROS, GERD-  Medicated and Controlled,  Endo/Other  negative endocrine ROSobesity  Renal/GU negative Renal ROS  negative genitourinary   Musculoskeletal negative musculoskeletal ROS (+)   Abdominal   Peds negative pediatric ROS (+)  Hematology Gestational thrombocytopenia. Plts 99 at time of placement. Will obtain a repeat plt count prior to removal of epidural   Anesthesia Other Findings   Reproductive/Obstetrics (+) Pregnancy                             Anesthesia Physical Anesthesia Plan  ASA: II  Anesthesia Plan: Epidural   Post-op Pain Management:    Induction:   Airway Management Planned:   Additional Equipment:   Intra-op Plan:   Post-operative Plan:   Informed Consent: I have reviewed the patients History and Physical, chart, labs and discussed the procedure including the risks, benefits and alternatives for the proposed anesthesia with the patient or authorized representative who has indicated his/her understanding and acceptance.   Dental advisory given  Plan Discussed with: CRNA  Anesthesia Plan Comments:         Anesthesia Quick Evaluation

## 2014-10-10 NOTE — Anesthesia Postprocedure Evaluation (Signed)
Anesthesia Post Note  Patient: Robin Little  Procedure(s) Performed: * No procedures listed *  Anesthesia type: Epidural  Patient location: Mother/Baby  Post pain: Pain level controlled  Post assessment: Post-op Vital signs reviewed  Last Vitals:  Filed Vitals:   10/10/14 1822  BP: 111/64  Pulse: 96  Temp: 36.8 C  Resp: 18    Post vital signs: Reviewed  Level of consciousness:alert  Complications: No apparent anesthesia complications Anesthesia Post Note  Patient: Robin Little  Procedure(s) Performed: * No procedures listed *  Anesthesia type: Epidural  Patient location: Mother/Baby  Post pain: Pain level controlled  Post assessment: Post-op Vital signs reviewed  Last Vitals:  Filed Vitals:   10/10/14 1822  BP: 111/64  Pulse: 96  Temp: 36.8 C  Resp: 18    Post vital signs: Reviewed  Level of consciousness:alert  Complications: No apparent anesthesia complications

## 2014-10-10 NOTE — Anesthesia Procedure Notes (Signed)
Epidural Patient location during procedure: OB  Staffing Anesthesiologist: Blase Beckner Performed by: anesthesiologist   Preanesthetic Checklist Completed: patient identified, site marked, surgical consent, pre-op evaluation, timeout performed, IV checked, risks and benefits discussed and monitors and equipment checked  Epidural Patient position: sitting Prep: site prepped and draped and DuraPrep Patient monitoring: continuous pulse ox and blood pressure Approach: midline Location: L3-L4 Injection technique: LOR saline  Needle:  Needle type: Tuohy  Needle gauge: 17 G Needle length: 9 cm and 9 Needle insertion depth: 6 cm Catheter type: closed end flexible Catheter size: 19 Gauge Catheter at skin depth: 10 cm Test dose: negative  Assessment Events: blood not aspirated, injection not painful, no injection resistance, negative IV test and no paresthesia  Additional Notes Patient identified. Risks/Benefits/Options discussed with patient including but not limited to bleeding, infection, nerve damage, paralysis, failed block, incomplete pain control, headache, blood pressure changes, nausea, vomiting, reactions to medication both or allergic, itching and postpartum back pain. Confirmed with bedside nurse the patient's most recent platelet count. Confirmed with patient that they are not currently taking any anticoagulation, have any bleeding history or any family history of bleeding disorders. Patient expressed understanding and wished to proceed. All questions were answered. Sterile technique was used throughout the entire procedure. Please see nursing notes for vital signs. Test dose was given through epidural catheter and negative prior to continuing to dose epidural or start infusion. Warning signs of high block given to the patient including shortness of breath, tingling/numbness in hands, complete motor block, or any concerning symptoms with instructions to call for help. Patient was  given instructions on fall risk and not to get out of bed. All questions and concerns addressed with instructions to call with any issues or inadequate analgesia.   Addressed with patient concerning symptoms/signs of epidural hematoma. Patient wished to proceed. Plt count 99 at time of placement. Will obtain plt count prior to removal of epidural.

## 2014-10-10 NOTE — Progress Notes (Signed)
After obtaining permission to remove pts epidural when removing dressing RN concerned b/c appeared to be actively bleeding from the site. Req Robin Little come assess pt and remove epidural if she deems appropriate.

## 2014-10-10 NOTE — H&P (Signed)
Robin Little is a 29 y.o. G 2 P 1 at 62 w 3 days presents for induction secondary to favorable cervix and thrombocytopenia.  Platelet  Count 122,000 on admission and now 99,000. Patient admitted last night and started on Pitocin and now status post epidural. History OB History    Gravida Para Term Preterm AB TAB SAB Ectopic Multiple Living   2 1 1       1      Past Medical History  Diagnosis Date  . Anemia   . GERD (gastroesophageal reflux disease)   . Hx of varicella   . History of shingles   . Headache(784.0)   . Anxiety   . Enlarged thyroid   . Hx of thrombocytopenia     has currently   Past Surgical History  Procedure Laterality Date  . No past surgeries     Family History: family history includes Alcohol abuse in her paternal grandfather; Arthritis in her mother; Cancer in her maternal grandmother and paternal grandmother; Diabetes in her maternal grandfather; Goiter in her maternal grandmother; Hyperlipidemia in her father and mother; Hypertension in her father and mother; Hypothyroidism in her mother; Parkinson's disease in her maternal grandfather. Social History:  reports that she has never smoked. She has never used smokeless tobacco. She reports that she drinks alcohol. She reports that she does not use illicit drugs.   Prenatal Transfer Tool  Maternal Diabetes: No Genetic Screening: Normal Maternal Ultrasounds/Referrals: Normal Fetal Ultrasounds or other Referrals:  None Maternal Substance Abuse: no Significant Maternal Medications:  None Significant Maternal Lab Results:  None Other Comments:  None  Review of Systems  All other systems reviewed and are negative.   Dilation: 3.5 Effacement (%): 90 Station: -2 Exam by:: Robin Little Blood pressure 115/72, pulse 81, temperature 97.7 F (36.5 C), temperature source Oral, resp. rate 18, height 5\' 6"  (1.676 m), weight 94.348 kg (208 lb), SpO2 99 %, unknown if currently breastfeeding. Maternal Exam:  Uterine  Assessment: Contraction strength is moderate.  Contraction frequency is regular.   Abdomen: Fetal presentation: vertex     Fetal Exam Fetal State Assessment: Category I - tracings are normal.     Physical Exam  Nursing note and vitals reviewed. Constitutional: She appears well-developed.  HENT:  Head: Normocephalic.  Eyes: Pupils are equal, round, and reactive to light.  Cardiovascular: Normal rate and regular rhythm.   Respiratory: Effort normal.  GI: Soft.    Prenatal labs: ABO, Rh: --/--/A POS, A POS (06/14 0052) Antibody: NEG (06/14 0052) Rubella: Immune (11/03 0000) RPR: Nonreactive (11/03 0000)  HBsAg: Negative (11/03 0000)  HIV: Non-reactive (11/03 0000)  GBS: Negative (06/10 0000)   Assessment/Plan: IUP at 39 w 3 days Induction of labor Thrombocytopenia Continue pitocin Anticipate NSVD Do not remove epidural until CBC obtained and cleared by anesthesia  Robin Little 10/10/2014, 8:58 AM

## 2014-10-11 LAB — CBC
HEMATOCRIT: 30.1 % — AB (ref 36.0–46.0)
HEMOGLOBIN: 10 g/dL — AB (ref 12.0–15.0)
MCH: 31.5 pg (ref 26.0–34.0)
MCHC: 33.2 g/dL (ref 30.0–36.0)
MCV: 95 fL (ref 78.0–100.0)
Platelets: 100 10*3/uL — ABNORMAL LOW (ref 150–400)
RBC: 3.17 MIL/uL — AB (ref 3.87–5.11)
RDW: 14.3 % (ref 11.5–15.5)
WBC: 9.3 10*3/uL (ref 4.0–10.5)

## 2014-10-11 MED ORDER — OXYCODONE-ACETAMINOPHEN 5-325 MG PO TABS: 1.0000 | ORAL_TABLET | ORAL | Status: DC | PRN

## 2014-10-11 MED ORDER — IBUPROFEN 600 MG PO TABS: 600.0000 mg | ORAL_TABLET | Freq: Four times a day (QID) | ORAL | Status: DC

## 2014-10-11 NOTE — Discharge Summary (Signed)
Obstetric Discharge Summary Reason for Admission: induction of labor Prenatal Procedures: ultrasound Intrapartum Procedures: spontaneous vaginal delivery Postpartum Procedures: none Complications-Operative and Postpartum: 2 degree perineal laceration HEMOGLOBIN  Date Value Ref Range Status  10/11/2014 10.0* 12.0 - 15.0 g/dL Final   HCT  Date Value Ref Range Status  10/11/2014 30.1* 36.0 - 46.0 % Final    Physical Exam:  General: alert and cooperative Lochia: appropriate Uterine Fundus: firm Incision: healing well DVT Evaluation: No evidence of DVT seen on physical exam. Negative Homan's sign. No cords or calf tenderness. Calf/Ankle edema is present.  Discharge Diagnoses: Term Pregnancy-delivered  Discharge Information: Date: 10/11/2014 Activity: pelvic rest Diet: routine Medications: PNV, Ibuprofen and Percocet Condition: stable Instructions: refer to practice specific booklet Discharge to: home   Newborn Data: Live born female  Birth Weight: 7 lb 13.2 oz (3550 g) APGAR: 8, 9  Home with mother.  Shavonn Convey G 10/11/2014, 8:37 AM

## 2014-10-11 NOTE — Lactation Note (Addendum)
This note was copied from the chart of Girl Realynn Konja. Lactation Consultation Note  P2.  Ex BF for 11.5 months. Mother has history of mastitis.  Reviewed S&S of mastitis, prevention, cluster feeding,engorgement care. Mother had a blister starting to develop on left nipple that she states has now resolved. Discussed how to achieve a deep latch. Provided mother with comfort gels and encouraged her to call if she needs assistance w/ latching. Mom encouraged to feed baby 8-12 times/24 hours and with feeding cues.  Mom made aware of O/P services, breastfeeding support groups, community resources, and our phone # for post-discharge questions.    Patient Name: Girl Shenise Sudduth XENMM'H Date: 10/11/2014     Maternal Data    Feeding    LATCH Score/Interventions                      Lactation Tools Discussed/Used     Consult Status      Dahlia Byes [facility] 10/11/2014, 10:47 AM

## 2014-10-11 NOTE — Progress Notes (Signed)
MOB was referred for history of depression/anxiety.  Referral is screened out by Clinical Social Worker because none of the following criteria appear to apply: -History of anxiety/depression during this pregnancy, or of post-partum depression. - Diagnosis of anxiety and/or depression within last 3 years or -MOB's symptoms are currently being treated with medication and/or therapy.  Per chart review, MOB has history of anxiety while in college. No recent symptoms documented.   Please contact the Clinical Social Worker if needs arise or upon MOB request.   Loleta Books, LCSW [phone]

## 2014-10-11 NOTE — Progress Notes (Signed)
Post Partum Day 1 Subjective: no complaints, up ad lib, voiding, tolerating PO, + flatus and desires early discharge  Objective: Blood pressure 104/62, pulse 69, temperature 98.5 F (36.9 C), temperature source Oral, resp. rate 18, height 5\' 6"  (1.676 m), weight 208 lb (94.348 kg), SpO2 100 %, unknown if currently breastfeeding.  Physical Exam:  General: alert and cooperative Lochia: appropriate Uterine Fundus: firm Incision: healing well DVT Evaluation: No evidence of DVT seen on physical exam. Negative Homan's sign. No cords or calf tenderness. No significant calf/ankle edema.   Recent Labs  10/10/14 1455 10/11/14 0546  HGB 11.4* 10.0*  HCT 34.5* 30.1*    Assessment/Plan: Discharge home   LOS: 1 day   Robin Little G 10/11/2014, 8:22 AM

## 2014-10-12 NOTE — Progress Notes (Signed)
Post Partum Day 2 Subjective: no complaints, up ad lib, voiding, tolerating PO, + flatus and discharge cancelled yesterday secondary to child at home sick  Objective: Blood pressure 105/70, pulse 70, temperature 98 F (36.7 C), temperature source Oral, resp. rate 20, height 5\' 6"  (1.676 m), weight 208 lb (94.348 kg), SpO2 100 %, unknown if currently breastfeeding.  Physical Exam:  General: alert and cooperative Lochia: appropriate Uterine Fundus: firm Incision: healing well DVT Evaluation: No evidence of DVT seen on physical exam. Negative Homan's sign. No cords or calf tenderness. No significant calf/ankle edema.   Recent Labs  10/10/14 1455 10/11/14 0546  HGB 11.4* 10.0*  HCT 34.5* 30.1*    Assessment/Plan: Discharge home   LOS: 2 days   CURTIS,CAROL G 10/12/2014, 8:33 AM

## 2015-05-01 MED FILL — SERTRALINE HCL 100 MG TAB: 100 | 90 days supply | Qty: 90 | Fill #0

## 2015-07-31 MED FILL — SERTRALINE HCL 100 MG TAB: 100 | 90 days supply | Qty: 90 | Fill #1

## 2015-09-14 DIAGNOSIS — H52229 Regular astigmatism, unspecified eye: Secondary | ICD-10-CM | POA: Diagnosis not present

## 2015-10-11 ENCOUNTER — Other Ambulatory Visit (HOSPITAL_COMMUNITY): Payer: Self-pay | Admitting: Physician Assistant

## 2015-10-11 DIAGNOSIS — J329 Chronic sinusitis, unspecified: Secondary | ICD-10-CM | POA: Diagnosis not present

## 2015-10-11 DIAGNOSIS — J342 Deviated nasal septum: Secondary | ICD-10-CM | POA: Diagnosis not present

## 2015-10-11 DIAGNOSIS — J0101 Acute recurrent maxillary sinusitis: Secondary | ICD-10-CM

## 2015-10-24 ENCOUNTER — Ambulatory Visit (HOSPITAL_COMMUNITY)
Admission: RE | Admit: 2015-10-24 | Discharge: 2015-10-24 | Disposition: A | Discharge status: Home / Self Care | Payer: 59 | Source: Ambulatory Visit | Attending: Physician Assistant | Admitting: Physician Assistant

## 2015-10-24 DIAGNOSIS — J328 Other chronic sinusitis: Secondary | ICD-10-CM | POA: Diagnosis not present

## 2015-10-24 DIAGNOSIS — J0101 Acute recurrent maxillary sinusitis: Secondary | ICD-10-CM

## 2015-10-24 DIAGNOSIS — J329 Chronic sinusitis, unspecified: Secondary | ICD-10-CM | POA: Diagnosis not present

## 2015-10-26 DIAGNOSIS — J329 Chronic sinusitis, unspecified: Secondary | ICD-10-CM | POA: Diagnosis not present

## 2015-10-26 DIAGNOSIS — J342 Deviated nasal septum: Secondary | ICD-10-CM | POA: Diagnosis not present

## 2015-10-29 MED FILL — SERTRALINE HCL 100 MG TAB: 100 | 30 days supply | Qty: 30 | Fill #2

## 2015-10-30 ENCOUNTER — Telehealth: Payer: 59 | Admitting: Family

## 2015-10-30 DIAGNOSIS — B373 Candidiasis of vulva and vagina: Secondary | ICD-10-CM

## 2015-10-30 DIAGNOSIS — B3731 Acute candidiasis of vulva and vagina: Secondary | ICD-10-CM

## 2015-10-30 MED ORDER — FLUCONAZOLE 150 MG PO TABS: 150.0000 mg | ORAL_TABLET | Freq: Once | ORAL | Status: DC

## 2015-10-30 NOTE — Progress Notes (Signed)

## 2015-10-31 MED FILL — FLUCONAZOLE 150 MG TABLET: 150 | 1 days supply | Qty: 1 | Fill #0

## 2015-12-03 MED FILL — SERTRALINE HCL 100 MG TAB: 100 | 30 days supply | Qty: 30 | Fill #3

## 2015-12-07 DIAGNOSIS — Z6827 Body mass index (BMI) 27.0-27.9, adult: Secondary | ICD-10-CM | POA: Diagnosis not present

## 2015-12-07 DIAGNOSIS — Z01419 Encounter for gynecological examination (general) (routine) without abnormal findings: Secondary | ICD-10-CM | POA: Diagnosis not present

## 2015-12-26 ENCOUNTER — Telehealth: Payer: 59 | Admitting: Nurse Practitioner

## 2015-12-26 DIAGNOSIS — B3731 Acute candidiasis of vulva and vagina: Secondary | ICD-10-CM

## 2015-12-26 DIAGNOSIS — B373 Candidiasis of vulva and vagina: Secondary | ICD-10-CM

## 2015-12-26 MED ORDER — FLUCONAZOLE 150 MG PO TABS: 150.0000 mg | ORAL_TABLET | Freq: Once | ORAL | 0 refills | Status: AC

## 2015-12-26 MED FILL — FLUCONAZOLE 150 MG TABLET: 150 | 1 days supply | Qty: 1 | Fill #0

## 2015-12-26 MED FILL — SERTRALINE HCL 100 MG TAB: 100 | 30 days supply | Qty: 30 | Fill #0

## 2015-12-26 NOTE — Progress Notes (Signed)

## 2016-02-06 MED FILL — SERTRALINE HCL 100 MG TAB: 100 | 90 days supply | Qty: 90 | Fill #0

## 2016-03-06 ENCOUNTER — Telehealth: Payer: PRIVATE HEALTH INSURANCE | Admitting: Physician Assistant

## 2016-03-06 DIAGNOSIS — B9689 Other specified bacterial agents as the cause of diseases classified elsewhere: Secondary | ICD-10-CM

## 2016-03-06 DIAGNOSIS — J019 Acute sinusitis, unspecified: Secondary | ICD-10-CM

## 2016-03-06 MED ORDER — AMOXICILLIN-POT CLAVULANATE 875-125 MG PO TABS: 1.0000 | ORAL_TABLET | Freq: Two times a day (BID) | ORAL | 0 refills | Status: DC

## 2016-03-06 MED ORDER — FLUCONAZOLE 150 MG PO TABS: 150.0000 mg | ORAL_TABLET | Freq: Once | ORAL | 0 refills | Status: AC

## 2016-03-06 MED FILL — FLUCONAZOLE 150 MG TABLET: 150 | 1 days supply | Qty: 1 | Fill #0

## 2016-03-06 MED FILL — AMOX-CLAV 875-125 MG TABLET: 875-125 | 7 days supply | Qty: 14 | Fill #0

## 2016-03-06 NOTE — Progress Notes (Signed)
We are sorry that you are not feeling well.  Here is how we plan to help!  Based on what you have shared with me it looks like you have sinusitis.  Sinusitis is inflammation and infection in the sinus cavities of the head.  Based on your presentation I believe you most likely have Acute Bacterial Sinusitis.  This is an infection caused by bacteria and is treated with antibiotics. I have prescribed Augmentin 875mg /125mg  one tablet twice daily with food, for 7 days. You may use an oral decongestant such as Mucinex D or if you have glaucoma or high blood pressure use plain Mucinex. Saline nasal spray help and can safely be used as often as needed for congestion.  If you develop worsening sinus pain, fever or notice severe headache and vision changes, or if symptoms are not better after completion of antibiotic, please schedule an appointment with a health care provider.    I have sent in a single Diflucan to take as directed in case of yeast infection.  Sinus infections are not as easily transmitted as other respiratory infection, however we still recommend that you avoid close contact with loved ones, especially the very young and elderly.  Remember to wash your hands thoroughly throughout the day as this is the number one way to prevent the spread of infection!  Home Care:  Only take medications as instructed by your medical team.  Complete the entire course of an antibiotic.  Do not take these medications with alcohol.  A steam or ultrasonic humidifier can help congestion.  You can place a towel over your head and breathe in the steam from hot water coming from a faucet.  Avoid close contacts especially the very young and the elderly.  Cover your mouth when you cough or sneeze.  Always remember to wash your hands.  Get Help Right Away If:  You develop worsening fever or sinus pain.  You develop a severe head ache or visual changes.  Your symptoms persist after you have completed your  treatment plan.  Make sure you  Understand these instructions.  Will watch your condition.  Will get help right away if you are not doing well or get worse.  Your e-visit answers were reviewed by a board certified advanced clinical practitioner to complete your personal care plan.  Depending on the condition, your plan could have included both over the counter or prescription medications.  If there is a problem please reply  once you have received a response from your provider.  Your safety is important to us.  If you have drug allergies check your prescription carefully.    You can use MyChart to ask questions about today's visit, request a non-urgent call back, or ask for a work or school excuse for 24 hours related to this e-Visit. If it has been greater than 24 hours you will need to follow up with your provider, or enter a new e-Visit to address those concerns.  You will get an e-mail in the next two days asking about your experience.  I hope that your e-visit has been valuable and will speed your recovery. Thank you for using e-visits.

## 2016-03-22 ENCOUNTER — Encounter (HOSPITAL_COMMUNITY): Payer: Self-pay | Admitting: Emergency Medicine

## 2016-03-22 ENCOUNTER — Emergency Department (HOSPITAL_COMMUNITY): Payer: 59

## 2016-03-22 ENCOUNTER — Emergency Department (HOSPITAL_COMMUNITY)
Admission: EM | Admit: 2016-03-22 | Discharge: 2016-03-22 | Disposition: A | Discharge status: Home / Self Care | Payer: 59 | Attending: Emergency Medicine | Admitting: Emergency Medicine

## 2016-03-22 DIAGNOSIS — N838 Other noninflammatory disorders of ovary, fallopian tube and broad ligament: Secondary | ICD-10-CM | POA: Insufficient documentation

## 2016-03-22 DIAGNOSIS — Z79899 Other long term (current) drug therapy: Secondary | ICD-10-CM | POA: Insufficient documentation

## 2016-03-22 DIAGNOSIS — N83201 Unspecified ovarian cyst, right side: Secondary | ICD-10-CM | POA: Diagnosis not present

## 2016-03-22 DIAGNOSIS — N949 Unspecified condition associated with female genital organs and menstrual cycle: Secondary | ICD-10-CM

## 2016-03-22 DIAGNOSIS — R1031 Right lower quadrant pain: Secondary | ICD-10-CM | POA: Diagnosis not present

## 2016-03-22 DIAGNOSIS — R52 Pain, unspecified: Secondary | ICD-10-CM

## 2016-03-22 LAB — URINALYSIS, ROUTINE W REFLEX MICROSCOPIC
BILIRUBIN URINE: NEGATIVE
Glucose, UA: NEGATIVE mg/dL
KETONES UR: NEGATIVE mg/dL
Leukocytes, UA: NEGATIVE
Nitrite: NEGATIVE
PROTEIN: NEGATIVE mg/dL
Specific Gravity, Urine: 1.016 (ref 1.005–1.030)
pH: 6 (ref 5.0–8.0)

## 2016-03-22 LAB — COMPREHENSIVE METABOLIC PANEL
ALBUMIN: 4.1 g/dL (ref 3.5–5.0)
ALK PHOS: 54 U/L (ref 38–126)
ALT: 13 U/L — AB (ref 14–54)
AST: 19 U/L (ref 15–41)
Anion gap: 5 (ref 5–15)
BILIRUBIN TOTAL: 0.7 mg/dL (ref 0.3–1.2)
BUN: 15 mg/dL (ref 6–20)
CALCIUM: 8.2 mg/dL — AB (ref 8.9–10.3)
CO2: 26 mmol/L (ref 22–32)
CREATININE: 0.76 mg/dL (ref 0.44–1.00)
Chloride: 108 mmol/L (ref 101–111)
GFR calc non Af Amer: >60 mL/min (ref >60)
GLUCOSE: 97 mg/dL (ref 65–99)
Potassium: 3.7 mmol/L (ref 3.5–5.1)
SODIUM: 139 mmol/L (ref 135–145)
Total Protein: 7 g/dL (ref 6.5–8.1)

## 2016-03-22 LAB — LIPASE, BLOOD: Lipase: 30 U/L (ref 11–51)

## 2016-03-22 LAB — URINE MICROSCOPIC-ADD ON

## 2016-03-22 LAB — CBC
HCT: 39.8 % (ref 36.0–46.0)
Hemoglobin: 12.9 g/dL (ref 12.0–15.0)
MCH: 30.1 pg (ref 26.0–34.0)
MCHC: 32.4 g/dL (ref 30.0–36.0)
MCV: 93 fL (ref 78.0–100.0)
PLATELETS: 116 10*3/uL — AB (ref 150–400)
RBC: 4.28 MIL/uL (ref 3.87–5.11)
RDW: 13.2 % (ref 11.5–15.5)
WBC: 5.4 10*3/uL (ref 4.0–10.5)

## 2016-03-22 LAB — PREGNANCY, URINE: PREG TEST UR: NEGATIVE

## 2016-03-22 MED ORDER — IBUPROFEN 800 MG PO TABS: 800.0000 mg | ORAL_TABLET | Freq: Three times a day (TID) | ORAL | 0 refills | Status: DC | PRN

## 2016-03-22 NOTE — Discharge Instructions (Signed)
Please follow up with your OBGYN for discussion of today's diagnosis. You will need a repeat ultrasound in 6-12 weeks. Ibuprofen as needed for pain.  Return to ER for new or worsening symptoms, any additional concerns.

## 2016-03-22 NOTE — ED Notes (Signed)
US at bedside

## 2016-03-22 NOTE — ED Provider Notes (Signed)
Care assumed from previous provider PA Upstill. Please see note for further details. Briefly, patient is a 30 y.o. female with waxing-waning RLQ pain. Case discussed, plan agreed u36pon. CT scan pending at shift change. All labs reviewed by me and reassuring. Will follow up scan, repeat abdominal exam and dispo appropriately.   IMPRESSION: No renal or ureteral stone or obstruction. 6 mm complex cystic structure in the right adnexa probably representing hemorrhagic cyst or endometrioma. Ultrasound is suggested for further evaluation. And intrauterine device is present.  CT reviewed and will order ultrasound for further evaluation.   Patient informed of CT results and US. Pain controlled at this time and with no complaints.   IMPRESSION: 1. No evidence of adnexal torsion. 2. Complex 6.8 cm right ovarian cyst most consistent with a large hemorrhagic cyst. Follow-up transvaginal pelvic ultrasound is advised in 6-12 weeks to document resolution, given the large size. This recommendation follows the consensus statement: Management of Asymptomatic Ovarian and Other Adnexal Cysts Imaged at US: Society of Radiologists in Ultrasound Consensus Conference Statement. Radiology 2010; [id number]:943-954. 3. Normal anteverted uterus. No uterine fibroids. IUD appears grossly well-positioned within the endometrial cavity. 4. Small volume free fluid in the pelvis.  Ultrasound reviewed and results discussed with patient. She has an OBGYN who she can follow up with easily. She is aware of the need for repeat ultrasound in 6-12 weeks and agrees to follow up with her OBGYN to do this. Reasons to follow up sooner or return to ER discussed. Patient states pain is manageable with ibuprofen. Rx given. All questions answered.    [facility], LPJaime Pilcher Robin Gravois, PA-C 03/22/16 1025    Robin NickAnthony Allen, MD 03/23/16 [phone]

## 2016-03-22 NOTE — ED Provider Notes (Signed)
[facility]-EMERGENCY DEPT Provider Note   CSN: [id number] Arrival date & time: 03/22/16  0331     History   Chief Complaint Chief Complaint  Patient presents with  . Abdominal Pain    HPI Robin Little is a 30 y.o. female.  Patient presents for evaluation of RLQ abdominal pain that woke her from sleep around 2:00 am. She reports the pain was "excrutiating". She tolerated the pain until around 3:30 before she woke her husband to come to the hospital. She vomited x 1 on the way to the emergency department. No fever. She denies vaginal discharge, dysuria, hematuria or history of kidney stones. The pain is not felt in her back or flank.     The history is provided by the patient. No language interpreter was used.  Abdominal Pain   Associated symptoms include vomiting. Pertinent negatives include fever, diarrhea, constipation and dysuria.    Past Medical History:  Diagnosis Date  . Anemia   . Anxiety   . Enlarged thyroid   . GERD (gastroesophageal reflux disease)   . Headache(784.0)   . History of shingles   . Hx of thrombocytopenia    has currently  . Hx of varicella     Patient Active Problem List   Diagnosis Date Noted  . Pregnancy 10/10/2014  . NSVD (normal spontaneous vaginal delivery) 10/10/2014  . 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 NO PAST SURGERIES      OB History    Gravida Para Term Preterm AB Living   2 2 2     2    SAB TAB Ectopic Multiple Live Births         0 2       Home Medications    Prior to Admission medications   Medication Sig Start Date End Date Taking? Authorizing Provider  acetaminophen (TYLENOL) 500 MG tablet Take 1,000 mg by mouth every 6 (six) hours as needed for moderate pain.   Yes Historical Provider, MD  ibuprofen (ADVIL,MOTRIN) 200 MG tablet Take 800 mg by mouth every 6 (six) hours as needed for moderate pain.   Yes Historical Provider, MD  sertraline (ZOLOFT) 100 MG  tablet Take 100 mg by mouth daily. 07/31/15  Yes Historical Provider, MD  amoxicillin-clavulanate (AUGMENTIN) 875-125 MG tablet Take 1 tablet by mouth 2 (two) times daily. Patient not taking: Reported on 03/22/2016 03/06/16   Waldon Merl, PA-C  ibuprofen (ADVIL,MOTRIN) 600 MG tablet Take 1 tablet (600 mg total) by mouth every 6 (six) hours. Patient not taking: Reported on 03/22/2016 10/11/14   Julio Sicks, NP  oxyCODONE-acetaminophen (PERCOCET/ROXICET) 5-325 MG per tablet Take 1-2 tablets by mouth every 4 (four) hours as needed. Patient not taking: Reported on 03/22/2016 10/11/14   Julio Sicks, NP    Family History Family History  Problem Relation Age of Onset  . Hyperlipidemia Mother   . Hypertension Mother   . Arthritis Mother   . Hypothyroidism Mother   . Hyperlipidemia Father   . Hypertension Father   . Cancer Maternal Grandmother     melanoma  . Goiter Maternal Grandmother   . Cancer Paternal Grandmother     liver  . Diabetes Maternal Grandfather   . Parkinson's disease Maternal Grandfather   . Alcohol abuse Paternal Grandfather     Social History Social History  Substance Use Topics  . Smoking status: Never Smoker  . Smokeless tobacco: Never Used  . Alcohol use Yes  Comment: social     Allergies   Patient has no known allergies.   Review of Systems Review of Systems  Constitutional: Negative for chills and fever.  Respiratory: Negative.   Cardiovascular: Negative.   Gastrointestinal: Positive for abdominal pain and vomiting. Negative for constipation and diarrhea.  Genitourinary: Negative for dysuria, flank pain and vaginal discharge.  Musculoskeletal: Negative.  Negative for back pain.  Neurological: Negative.  Negative for dizziness and weakness.     Physical Exam Updated Vital Signs BP 124/80 (BP Location: Left Arm)   Pulse (!) 58   Temp 97.8 F (36.6 C) (Oral)   Resp 16   Ht 5\' 6"  (1.676 m)   Wt 79.4 kg   SpO2 98%   BMI 28.25 kg/m    Physical Exam  Constitutional: She is oriented to person, place, and time. She appears well-developed and well-nourished.  Neck: Normal range of motion.  Pulmonary/Chest: Effort normal.  Abdominal: Soft. There is tenderness (Tenderness limited to RLQ and suprapubic area, greater in RLQ.  No guarding or rebound.).  Musculoskeletal: Normal range of motion.  Neurological: She is alert and oriented to person, place, and time.  Skin: Skin is warm and dry.     ED Treatments / Results  Labs (all labs ordered are listed, but only abnormal results are displayed) Labs Reviewed  COMPREHENSIVE METABOLIC PANEL - Abnormal; Notable for the following:       Result Value   Calcium 8.2 (*)    ALT 13 (*)    All other components within normal limits  CBC - Abnormal; Notable for the following:    Platelets 116 (*)    All other components within normal limits  URINALYSIS, ROUTINE W REFLEX MICROSCOPIC (NOT AT [facility]) - Abnormal; Notable for the following:    Hgb urine dipstick MODERATE (*)    All other components within normal limits  URINE MICROSCOPIC-ADD ON - Abnormal; Notable for the following:    Squamous Epithelial / LPF 0-5 (*)    Bacteria, UA RARE (*)    All other components within normal limits  LIPASE, BLOOD  PREGNANCY, URINE   Results for orders placed or performed during the hospital encounter of 03/22/16  Lipase, blood  Result Value Ref Range   Lipase 30 11 - 51 U/L  Comprehensive metabolic panel  Result Value Ref Range   Sodium 139 135 - 145 mmol/L   Potassium 3.7 3.5 - 5.1 mmol/L   Chloride 108 101 - 111 mmol/L   CO2 26 22 - 32 mmol/L   Glucose, Bld 97 65 - 99 mg/dL   BUN 15 6 - 20 mg/dL   Creatinine, Ser 1.910.76 0.44 - 1.00 mg/dL   Calcium 8.2 (L) 8.9 - 10.3 mg/dL   Total Protein 7.0 6.5 - 8.1 g/dL   Albumin 4.1 3.5 - 5.0 g/dL   AST 19 15 - 41 U/L   ALT 13 (L) 14 - 54 U/L   Alkaline Phosphatase 54 38 - 126 U/L   Total Bilirubin 0.7 0.3 - 1.2 mg/dL   GFR calc non Af Amer  >60 >60 mL/min   GFR calc Af Amer >60 >60 mL/min   Anion gap 5 5 - 15  CBC  Result Value Ref Range   WBC 5.4 4.0 - 10.5 K/uL   RBC 4.28 3.87 - 5.11 MIL/uL   Hemoglobin 12.9 12.0 - 15.0 g/dL   HCT 47.839.8 29.536.0 - 62.146.0 %   MCV 93.0 78.0 - 100.0 fL   MCH 30.1  26.0 - 34.0 pg   MCHC 32.4 30.0 - 36.0 g/dL   RDW 09.813.2 11.911.5 - 14.715.5 %   Platelets 116 (L) 150 - 400 K/uL  Urinalysis, Routine w reflex microscopic  Result Value Ref Range   Color, Urine YELLOW YELLOW   APPearance CLEAR CLEAR   Specific Gravity, Urine 1.016 1.005 - 1.030   pH 6.0 5.0 - 8.0   Glucose, UA NEGATIVE NEGATIVE mg/dL   Hgb urine dipstick MODERATE (A) NEGATIVE   Bilirubin Urine NEGATIVE NEGATIVE   Ketones, ur NEGATIVE NEGATIVE mg/dL   Protein, ur NEGATIVE NEGATIVE mg/dL   Nitrite NEGATIVE NEGATIVE   Leukocytes, UA NEGATIVE NEGATIVE  Pregnancy, urine  Result Value Ref Range   Preg Test, Ur NEGATIVE NEGATIVE  Urine microscopic-add on  Result Value Ref Range   Squamous Epithelial / LPF 0-5 (A) NONE SEEN   WBC, UA 0-5 0 - 5 WBC/hpf   RBC / HPF 6-30 0 - 5 RBC/hpf   Bacteria, UA RARE (A) NONE SEEN    EKG  EKG Interpretation None       Radiology No results found.  Procedures Procedures (including critical care time)  Medications Ordered in ED Medications - No data to display   Initial Impression / Assessment and Plan / ED Course  I have reviewed the triage vital signs and the nursing notes.  Pertinent labs & imaging results that were available during my care of the patient were reviewed by me and considered in my medical decision making (see chart for details).  Clinical Course    Patient woken up with right lower quadrant pain, severe then resolved with only a small amount of residual tenderness now. She had vomiting, no fever.   DDx: ovarian cyst vs kidney stone vs appendix  Will obtain a CT scan for further evaluation. Patient's pain is mild currently. No pain medications requested.   Patient care  signed out to [facility] Ward, PA-C pending CT scan to determine diagnosis and appropriate disposition.   Final Clinical Impressions(s) / ED Diagnoses   Final diagnoses:  None   1. Abdominal pain  New Prescriptions New Prescriptions   No medications on file     Elpidio AnisShari Arelys Glassco, Cordelia Poche-C 03/22/16 0551    Dione Boozeavid Glick, MD 03/22/16 [phone]

## 2016-03-22 NOTE — ED Triage Notes (Signed)
Pt report sudden onset of RLQ pain along with nausea and vomiting that started at 0230 tonight.

## 2016-03-22 NOTE — ED Notes (Signed)
Patient transported to CT 

## 2016-04-07 ENCOUNTER — Telehealth: Payer: PRIVATE HEALTH INSURANCE | Admitting: Family

## 2016-04-07 DIAGNOSIS — N898 Other specified noninflammatory disorders of vagina: Secondary | ICD-10-CM

## 2016-04-07 NOTE — Progress Notes (Signed)
Based on what you shared with me it looks like you have a serious condition that should be evaluated in a face to face office visit.  NOTE: Even if you have entered your credit card information for this eVisit, you will not be charged.   If you are having a true medical emergency please call 911.  If you need an urgent face to face visit, [place] has four urgent care centers for your convenience.  If you need care fast and have a high deductible or no insurance consider:   [email or web address]  [phone]  [address], [address] [place], [place] [address] 8 am to 8 pm Monday-Friday 10 am to 4 pm Saturday-Sunday   The following sites will take your  insurance:    . [facility]  [phone] Get Driving Directions Find a Provider at this Location  [address] [place], [place] [address] . 10 am to 8 pm Monday-Friday . 12 pm to 8 pm Saturday-Sunday   . [facility]  [phone] Get Driving Directions Find a Provider at this Location  [address], [address] [place], [place] [address] . 8 am to 8 pm Monday-Friday . 9 am to 6 pm Saturday . 11 am to 6 pm Sunday   . [facility] [place]  [phone] Get Driving Directions  [address] [place], [place] [address] . 8 am to 8 pm Monday-Friday . 8 am to 4 pm Saturday-Sunday   . [facility] (walk-ins welcome, or call for a scheduled time)  [phone]  Get Driving Directions Find a Provider at this Location  [address] [place],  [address] . 8 am to 8:30 pm Monday-Thursday . 8 am to 6 pm Friday . 8 am to 4 pm Saturday-Sunday   Your e-visit answers were reviewed by a board certified advanced clinical practitioner to complete your personal care plan.  Thank you for using e-Visits. 

## 2016-04-14 DIAGNOSIS — N83291 Other ovarian cyst, right side: Secondary | ICD-10-CM | POA: Diagnosis not present

## 2016-05-06 MED FILL — SERTRALINE HCL 100 MG TAB: 100 | 90 days supply | Qty: 90 | Fill #1

## 2016-05-23 ENCOUNTER — Telehealth: Payer: PRIVATE HEALTH INSURANCE | Admitting: Family

## 2016-05-23 DIAGNOSIS — M5440 Lumbago with sciatica, unspecified side: Secondary | ICD-10-CM

## 2016-05-23 MED ORDER — ETODOLAC 300 MG PO CAPS: 300.0000 mg | ORAL_CAPSULE | Freq: Two times a day (BID) | ORAL | 0 refills | Status: DC

## 2016-05-23 MED ORDER — BACLOFEN 10 MG PO TABS: 10.0000 mg | ORAL_TABLET | Freq: Three times a day (TID) | ORAL | 0 refills | Status: DC | PRN

## 2016-05-23 MED FILL — ETODOLAC 300 MG CAPSULE: 300 | 10 days supply | Qty: 20 | Fill #0

## 2016-05-23 MED FILL — BACLOFEN 10 MG TABLET: 10 | 10 days supply | Qty: 30 | Fill #0

## 2016-05-23 NOTE — Progress Notes (Signed)

## 2016-07-01 ENCOUNTER — Telehealth: Payer: PRIVATE HEALTH INSURANCE | Admitting: Family

## 2016-07-01 DIAGNOSIS — N760 Acute vaginitis: Secondary | ICD-10-CM

## 2016-07-01 NOTE — Progress Notes (Signed)
Based on what you shared with me it looks like you have a serious condition that should be evaluated in a face to face office visit.  NOTE: Even if you have entered your credit card information for this eVisit, you will not be charged.   If you are having a true medical emergency please call 911.  If you need an urgent face to face visit, [place] has four urgent care centers for your convenience.  If you need care fast and have a high deductible or no insurance consider:   [email or web address]  [phone]  [address], [address] [place], [place] [address] 8 am to 8 pm Monday-Friday 10 am to 4 pm Saturday-Sunday   The following sites will take your  insurance:    . [facility]  [phone] Get Driving Directions Find a Provider at this Location  [address] [place], [place] [address] . 10 am to 8 pm Monday-Friday . 12 pm to 8 pm Saturday-Sunday   . [facility]  [phone] Get Driving Directions Find a Provider at this Location  [address], [address] , [place] [address] . 8 am to 8 pm Monday-Friday . 9 am to 6 pm Saturday . 11 am to 6 pm Sunday   . [facility] [place]  [phone] Get Driving Directions  [address] [place],  [address] . 8 am to 8 pm Monday-Friday . 8 am to 4 pm Saturday-Sunday   Your e-visit answers were reviewed by a board certified advanced clinical practitioner to complete your personal care plan.  Thank you for using e-Visits.  

## 2016-07-14 ENCOUNTER — Encounter: Payer: Self-pay | Admitting: Internal Medicine

## 2016-07-14 ENCOUNTER — Ambulatory Visit (INDEPENDENT_AMBULATORY_CARE_PROVIDER_SITE_OTHER): Payer: 59 | Admitting: Internal Medicine

## 2016-07-14 VITALS — BP 110/78 | HR 65 | Temp 97.9°F | Ht 65.5 in | Wt 181.5 lb

## 2016-07-14 DIAGNOSIS — K219 Gastro-esophageal reflux disease without esophagitis: Secondary | ICD-10-CM | POA: Diagnosis not present

## 2016-07-14 DIAGNOSIS — D696 Thrombocytopenia, unspecified: Secondary | ICD-10-CM

## 2016-07-14 DIAGNOSIS — F419 Anxiety disorder, unspecified: Secondary | ICD-10-CM | POA: Insufficient documentation

## 2016-07-14 NOTE — Assessment & Plan Note (Signed)
Will monitor platelets She will monitor for s/s of bleeding

## 2016-07-14 NOTE — Assessment & Plan Note (Signed)
Controlled on Zoloft She does not need refills today Will monitor

## 2016-07-14 NOTE — Patient Instructions (Signed)
Generalized Anxiety Disorder, Adult Generalized anxiety disorder (GAD) is a mental health disorder. People with this condition constantly worry about everyday events. Unlike normal anxiety, worry related to GAD is not triggered by a specific event. These worries also do not fade or get better with time. GAD interferes with life functions, including relationships, work, and school. GAD can vary from mild to severe. People with severe GAD can have intense waves of anxiety with physical symptoms (panic attacks). What are the causes? The exact cause of GAD is not known. What increases the risk? This condition is more likely to develop in:  Women.  People who have a family history of anxiety disorders.  People who are very shy.  People who experience very stressful life events, such as the death of a loved one.  People who have a very stressful family environment. What are the signs or symptoms? People with GAD often worry excessively about many things in their lives, such as their health and family. They may also be overly concerned about:  Doing well at work.  Being on time.  Natural disasters.  Friendships. Physical symptoms of GAD include:  Fatigue.  Muscle tension or having muscle twitches.  Trembling or feeling shaky.  Being easily startled.  Feeling like your heart is pounding or racing.  Feeling out of breath or like you cannot take a deep breath.  Having trouble falling asleep or staying asleep.  Sweating.  Nausea, diarrhea, or irritable bowel syndrome (IBS).  Headaches.  Trouble concentrating or remembering facts.  Restlessness.  Irritability. How is this diagnosed? Your health care provider can diagnose GAD based on your symptoms and medical history. You will also have a physical exam. The health care provider will ask specific questions about your symptoms, including how severe they are, when they started, and if they come and go. Your health care  provider may ask you about your use of alcohol or drugs, including prescription medicines. Your health care provider may refer you to a mental health specialist for further evaluation. Your health care provider will do a thorough examination and may perform additional tests to rule out other possible causes of your symptoms. To be diagnosed with GAD, a person must have anxiety that:  Is out of his or her control.  Affects several different aspects of his or her life, such as work and relationships.  Causes distress that makes him or her unable to take part in normal activities.  Includes at least three physical symptoms of GAD, such as restlessness, fatigue, trouble concentrating, irritability, muscle tension, or sleep problems. Before your health care provider can confirm a diagnosis of GAD, these symptoms must be present more days than they are not, and they must last for six months or longer. How is this treated? The following therapies are usually used to treat GAD:  Medicine. Antidepressant medicine is usually prescribed for long-term daily control. Antianxiety medicines may be added in severe cases, especially when panic attacks occur.  Talk therapy (psychotherapy). Certain types of talk therapy can be helpful in treating GAD by providing support, education, and guidance. Options include:  Cognitive behavioral therapy (CBT). People learn coping skills and techniques to ease their anxiety. They learn to identify unrealistic or negative thoughts and behaviors and to replace them with positive ones.  Acceptance and commitment therapy (ACT). This treatment teaches people how to be mindful as a way to cope with unwanted thoughts and feelings.  Biofeedback. This process trains you to manage your body's response (  physiological response) through breathing techniques and relaxation methods. You will work with a therapist while machines are used to monitor your physical symptoms.  Stress  management techniques. These include yoga, meditation, and exercise. A mental health specialist can help determine which treatment is best for you. Some people see improvement with one type of therapy. However, other people require a combination of therapies. Follow these instructions at home:  Take over-the-counter and prescription medicines only as told by your health care provider.  Try to maintain a normal routine.  Try to anticipate stressful situations and allow extra time to manage them.  Practice any stress management or self-calming techniques as taught by your health care provider.  Do not punish yourself for setbacks or for not making progress.  Try to recognize your accomplishments, even if they are small.  Keep all follow-up visits as told by your health care provider. This is important. Contact a health care provider if:  Your symptoms do not get better.  Your symptoms get worse.  You have signs of depression, such as:  A persistently sad, cranky, or irritable mood.  Loss of enjoyment in activities that used to bring you joy.  Change in weight or eating.  Changes in sleeping habits.  Avoiding friends or family members.  Loss of energy for normal tasks.  Feelings of guilt or worthlessness. Get help right away if:  You have serious thoughts about hurting yourself or others. If you ever feel like you may hurt yourself or others, or have thoughts about taking your own life, get help right away. You can go to your nearest emergency department or call:  Your local emergency services (911 in the [place]  A suicide crisis helpline, such as the National Suicide Prevention Lifeline at 1-800-273-8255. This is open 24 hours a day. Summary  Generalized anxiety disorder (GAD) is a mental health disorder that involves worry that is not triggered by a specific event.  People with GAD often worry excessively about many things in their lives, such as their health and  family.  GAD may cause physical symptoms such as restlessness, trouble concentrating, sleep problems, frequent sweating, nausea, diarrhea, headaches, and trembling or muscle twitching.  A mental health specialist can help determine which treatment is best for you. Some people see improvement with one type of therapy. However, other people require a combination of therapies. This information is not intended to replace advice given to you by your health care provider. Make sure you discuss any questions you have with your health care provider. Document Released: 08/09/2012 Document Revised: 03/04/2016 Document Reviewed: 03/04/2016 Elsevier Interactive Patient Education  2017 Elsevier Inc.  

## 2016-07-14 NOTE — Progress Notes (Signed)
HPI  Pt presents to the clinic today to establish care and for management of the conditions listed below. She is transferring care from Tomi Bamberger, NP.  Anemia: Secondary to gestational thrombocytopenia. They are monitoring her platelets. She does not have periods with IUD. She has not noticed any s/s of bleeding.  Anxiety: Triggered by school and family stress. She takes Zoloft with good relief.  GERD: Only with pregnancies. Currently not an issue.  Flu: 12/2015 Tetanus: 06/2014 Pap Smear: 10/2015 at [facility] Dentist: as needed   Past Medical History:  Diagnosis Date  . Anemia   . Anxiety   . Enlarged thyroid   . GERD (gastroesophageal reflux disease)   . Headache(784.0)   . History of shingles   . Hx of thrombocytopenia    has currently  . Hx of varicella     Current Outpatient Prescriptions  Medication Sig Dispense Refill  . acetaminophen (TYLENOL) 500 MG tablet Take 1,000 mg by mouth every 6 (six) hours as needed for moderate pain.    Marland Kitchen ibuprofen (ADVIL,MOTRIN) 800 MG tablet Take 1 tablet (800 mg total) by mouth every 8 (eight) hours as needed. 21 tablet 0  . levonorgestrel (MIRENA) 20 MCG/24HR IUD 1 each by Intrauterine route once. Inserted 2016    . sertraline (ZOLOFT) 100 MG tablet Take 100 mg by mouth daily.     No current facility-administered medications for this visit.     No Known Allergies  Family History  Problem Relation Age of Onset  . Hyperlipidemia Mother   . Hypertension Mother   . Arthritis Mother   . Hypothyroidism Mother   . Hyperlipidemia Father   . Hypertension Father   . Cancer Maternal Grandmother     melanoma  . Goiter Maternal Grandmother   . Cancer Paternal Grandmother     liver  . Diabetes Maternal Grandfather   . Parkinson's disease Maternal Grandfather   . Hyperlipidemia Maternal Grandfather   . Hypertension Maternal Grandfather   . Alcohol abuse Paternal Grandfather     Social History   Social History  .  Marital status: Married    Spouse name: N/A  . Number of children: N/A  . Years of education: N/A   Occupational History  . Not on file.   Social History Main Topics  . Smoking status: Never Smoker  . Smokeless tobacco: Never Used  . Alcohol use Yes     Comment: social  . Drug use: No  . Sexual activity: Yes   Other Topics Concern  . Not on file   Social History Narrative  . No narrative on file    ROS:  Constitutional: Denies fever, malaise, fatigue, headache or abrupt weight changes.  HEENT: Denies eye pain, eye redness, ear pain, ringing in the ears, wax buildup, runny nose, nasal congestion, bloody nose, or sore throat. Respiratory: Denies difficulty breathing, shortness of breath, cough or sputum production.   Cardiovascular: Denies chest pain, chest tightness, palpitations or swelling in the hands or feet.  Gastrointestinal: Denies abdominal pain, bloating, constipation, diarrhea or blood in the stool.  GU: Denies frequency, urgency, pain with urination, blood in urine, odor or discharge. Musculoskeletal: Pt reports intermittent low back pain. Denies decrease in range of motion, difficulty with gait, or joint pain and swelling.  Skin: Denies redness, rashes, lesions or ulcercations.  Neurological: Denies dizziness, difficulty with memory, difficulty with speech or problems with balance and coordination.  Psych: Denies anxiety, depression, SI/HI.  No other specific complaints in  a complete review of systems (except as listed in HPI above).  PE:  BP 110/78   Pulse 65   Temp 97.9 F (36.6 C) (Oral)   Ht 5' 5.5" (1.664 m)   Wt 181 lb 8 oz (82.3 kg)   SpO2 98%   BMI 29.74 kg/m  Wt Readings from Last 3 Encounters:  07/14/16 181 lb 8 oz (82.3 kg)  03/22/16 175 lb (79.4 kg)  10/10/14 208 lb (94.3 kg)    General: Appears her stated age, well developed, well nourished in NAD. Cardiovascular: Normal rate and rhythm. S1,S2 noted.  No murmur, rubs or gallops noted.   Pulmonary/Chest: Normal effort and positive vesicular breath sounds. No respiratory distress. No wheezes, rales or ronchi noted.  Musculoskeletal: No difficulty with gait.  Neurological: Alert and oriented.  Psychiatric: Mood and affect normal. Behavior is normal. Judgment and thought content normal.     BMET    Component Value Date/Time   NA 139 03/22/2016 0419   K 3.7 03/22/2016 0419   CL 108 03/22/2016 0419   CO2 26 03/22/2016 0419   GLUCOSE 97 03/22/2016 0419   BUN 15 03/22/2016 0419   CREATININE 0.76 03/22/2016 0419   CALCIUM 8.2 (L) 03/22/2016 0419   GFRNONAA >60 03/22/2016 0419   GFRAA >60 03/22/2016 0419    Lipid Panel  No results found for: CHOL, TRIG, HDL, CHOLHDL, VLDL, LDLCALC  CBC    Component Value Date/Time   WBC 5.4 03/22/2016 0419   RBC 4.28 03/22/2016 0419   HGB 12.9 03/22/2016 0419   HCT 39.8 03/22/2016 0419   PLT 116 (L) 03/22/2016 0419   MCV 93.0 03/22/2016 0419   MCH 30.1 03/22/2016 0419   MCHC 32.4 03/22/2016 0419   RDW 13.2 03/22/2016 0419    Hgb A1C No results found for: HGBA1C   Assessment and Plan:

## 2016-07-14 NOTE — Assessment & Plan Note (Signed)
Currently not an issue Will monitor 

## 2016-08-12 MED FILL — SERTRALINE HCL 100 MG TAB: 100 | 90 days supply | Qty: 90 | Fill #2

## 2016-08-19 ENCOUNTER — Telehealth: Payer: PRIVATE HEALTH INSURANCE | Admitting: Family

## 2016-08-19 DIAGNOSIS — B029 Zoster without complications: Secondary | ICD-10-CM

## 2016-08-19 MED ORDER — GABAPENTIN 300 MG PO CAPS: 300.0000 mg | ORAL_CAPSULE | Freq: Two times a day (BID) | ORAL | 0 refills | Status: DC | PRN

## 2016-08-19 MED ORDER — VALACYCLOVIR HCL 1 G PO TABS: 1000.0000 mg | ORAL_TABLET | Freq: Three times a day (TID) | ORAL | 0 refills | Status: AC

## 2016-08-19 MED FILL — valACYclovir HCL 1 GM TABS: 1 | 7 days supply | Qty: 21 | Fill #0

## 2016-08-19 MED FILL — GABAPENTIN 300 MG CAPSULE: 300 | 20 days supply | Qty: 40 | Fill #0

## 2016-08-19 NOTE — Progress Notes (Signed)
E-visit for Shingles   We are sorry that you are not feeling well. Here is how we plan to help!  Based on what you shared with me it looks like you have shingles.  Shingles or herpes zoster, is a common infection of the nerves.  It is a painful rash caused by the herpes zoster virus.  This is the same virus that causes chickenpox.  After a person has chickenpox, the virus remains inactive in the nerve cells.  Years later, the virus can become active again and travel to the skin.  It typically will appear on one side of the face or body.  Burning or shooting pain, tingling, or itching are early signs of the infection.  Blisters typically scab over in 7 to 10 days and clear up within 2-4 weeks. Shingles is only contagious to people that have never had the chickenpox, the chickenpox vaccine, or anyone who has a compromised immune system.  You should avoid contact with these type of people until your blisters scab over.  I have prescribed Valacyclovir 1g three times daily for 7 days and also Gabapentin 300mg twice daily as needed for pain   HOME CARE: . Apply ice packs (wrapped in a thin towel), cool compresses, or soak in cool bath to help reduce pain. . Use calamine lotion to calm itchy skin. . Avoid scratching the rash. . Avoid direct sunlight.  GET HELP RIGHT AWAY IF: . Symptoms that don't away after treatment. . A rash or blisters near your eye. . Increased drainage, fever, or rash after treatment. . Severe pain that doesn't go away.   MAKE SURE YOU    Understand these instructions.  Will watch your condition.  Will get help right away if you are not doing well or get worse.  Thank you for choosing an e-visit. Your e-visit answers were reviewed by a board certified advanced clinical practitioner to complete your personal care plan. Depending upon the condition, your plan could have included both over the counter or prescription medications.  Please review your pharmacy choice.  Make sure the pharmacy is open so you can pick up prescription now. If there is a problem, you may contact your provider through MyChart messaging and have the prescription routed to another pharmacy.  Your safety is important to us. If you have drug allergies check your prescription carefully.   For the next 24 hours you can use MyChart to ask questions about today's visit, request a non-urgent call back, or ask for a work or school excuse.  You will get an email in the next two days asking about your experience. I hope that your e-visit has been valuable and will speed your recovery  

## 2016-08-20 ENCOUNTER — Ambulatory Visit (INDEPENDENT_AMBULATORY_CARE_PROVIDER_SITE_OTHER): Payer: 59 | Admitting: Family Medicine

## 2016-08-20 ENCOUNTER — Encounter: Payer: Self-pay | Admitting: Family Medicine

## 2016-08-20 VITALS — BP 110/68 | HR 84 | Temp 98.0°F | Wt 185.2 lb

## 2016-08-20 DIAGNOSIS — B029 Zoster without complications: Secondary | ICD-10-CM

## 2016-08-20 NOTE — Progress Notes (Signed)
Pre visit review using our clinic review tool, if applicable. No additional management support is needed unless otherwise documented below in the visit note. 

## 2016-08-20 NOTE — Patient Instructions (Addendum)
I agree with shingles. Finish valtrex. Use gabapentin as needed.  We will fill out FMLA forms for work.  I expect ok to return sometime next week.

## 2016-08-20 NOTE — Assessment & Plan Note (Signed)
Exam consistent with R anterior chest shingles. She has started valtrex and gabapentin. Discussed.  Advised by infection control she needed to be out of work from yesterday until rash has crusted.  I will fill out FMLA forms when they arrive.  Likely ok to return on Monday 4/30 - she will let me know when rash has crusted over.

## 2016-08-20 NOTE — Progress Notes (Signed)
BP 110/68   Pulse 84   Temp 98 F (36.7 C) (Oral)   Wt 185 lb 4 oz (84 kg)   SpO2 98%   BMI 30.36 kg/m    CC: ?shingles Subjective:    Patient ID: Robin Little, female    DOB: [birth year], 31 y.o.   MRN: [id number]  HPI: Robin Little is a 31 y.o. female presenting on 08/20/2016 for Rash (did Evisit and was Dx with shingles)   Evisit yesterday completed, diagnosed with shingles and started on valtrex 1gm TID x 7 days and gabapentin  bid prn.   Itching started over weekend at R bra line, then this turned into sharp shooting pain. Yesterday blistering rash started along R bra line into flank. She tried hydrocortisone cream. "feels like real bad sunburn, and pulled muscle".   h/o shingles in the past (age 65yo), at same location, this feels similar.   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  Asks about FMLA for work.   Relevant past medical, surgical, family and social history reviewed and updated as indicated. Interim medical history since our last visit reviewed. Allergies and medications reviewed and updated. Outpatient Medications Prior to Visit  Medication Sig Dispense Refill  . acetaminophen (TYLENOL) 500 MG tablet Take 1,000 mg by mouth every 6 (six) hours as needed for moderate pain.    Marland Kitchen gabapentin (NEURONTIN) 300 MG capsule Take 1 capsule (300 mg total) by mouth 2 (two) times daily as needed. For pain 40 capsule 0  . ibuprofen (ADVIL,MOTRIN) 800 MG tablet Take 1 tablet (800 mg total) by mouth every 8 (eight) hours as needed. 21 tablet 0  . levonorgestrel (MIRENA) 20 MCG/24HR IUD 1 each by Intrauterine route once. Inserted 2016    . sertraline (ZOLOFT) 100 MG tablet Take 100 mg by mouth daily.    . valACYclovir (VALTREX) 1000 MG tablet Take 1 tablet (1,000 mg total) by mouth every 8 (eight) hours. 21 tablet 0   No facility-administered medications prior to visit.      Per HPI unless specifically indicated in ROS section  below Review of Systems     Objective:    BP 110/68   Pulse 84   Temp 98 F (36.7 C) (Oral)   Wt 185 lb 4 oz (84 kg)   SpO2 98%   BMI 30.36 kg/m   Wt Readings from Last 3 Encounters:  08/20/16 185 lb 4 oz (84 kg)  07/14/16 181 lb 8 oz (82.3 kg)  03/22/16 175 lb (79.4 kg)    Physical Exam  Constitutional: She appears well-developed and well-nourished. No distress.  Skin: Skin is warm and dry. Rash noted. There is erythema.  Erythematous microvesicular clusters at R braline and into R flank, along ~R T5/6 dermatome  Nursing note and vitals reviewed.     Assessment & Plan:   Problem List Items Addressed This Visit    Shingles - Primary    Exam consistent with R anterior chest shingles. She has started valtrex and gabapentin. Discussed.  Advised by infection control she needed to be out of work from yesterday until rash has crusted.  I will fill out FMLA forms when they arrive.  Likely ok to return on Monday 4/30 - she will let me know when rash has crusted over.           Follow up plan: Return if symptoms worsen or fail to improve.  Eustaquio Boyden, MD

## 2016-08-21 ENCOUNTER — Telehealth: Payer: Self-pay | Admitting: Internal Medicine

## 2016-08-21 NOTE — Telephone Encounter (Signed)
FMLA paperwork in dr g IN BOX  For review and signature

## 2016-08-23 NOTE — Telephone Encounter (Signed)
Filled and in my outbox

## 2016-08-25 ENCOUNTER — Encounter: Payer: Self-pay | Admitting: Family Medicine

## 2016-08-25 NOTE — Telephone Encounter (Signed)
Can we prepare updated FMLA forms requesting time off through 08/28/2016? Will cc: Zella Ball and Lyla Son as well - as forms are not yet scanned in chart.

## 2016-08-26 NOTE — Telephone Encounter (Signed)
Pt needed paperwork to revised to be out of work till 5/3 Sent to dr g to initial and date changes    Original paperwork Paperwork fax 5/1  Copy for pt Copy for scan Copy for file

## 2016-08-26 NOTE — Telephone Encounter (Signed)
Updated and placed in my out box.

## 2016-08-27 NOTE — Telephone Encounter (Signed)
REVISED COPY  Copy for pt Copy for file Copy for scan Sent my chart message to pt

## 2016-09-02 ENCOUNTER — Encounter: Payer: PRIVATE HEALTH INSURANCE | Admitting: Internal Medicine

## 2016-09-09 ENCOUNTER — Ambulatory Visit (INDEPENDENT_AMBULATORY_CARE_PROVIDER_SITE_OTHER): Payer: 59 | Admitting: Internal Medicine

## 2016-09-09 ENCOUNTER — Encounter: Payer: Self-pay | Admitting: Internal Medicine

## 2016-09-09 VITALS — BP 106/66 | HR 70 | Temp 98.2°F | Ht 65.5 in | Wt 185.5 lb

## 2016-09-09 DIAGNOSIS — K219 Gastro-esophageal reflux disease without esophagitis: Secondary | ICD-10-CM

## 2016-09-09 DIAGNOSIS — E010 Iodine-deficiency related diffuse (endemic) goiter: Secondary | ICD-10-CM

## 2016-09-09 DIAGNOSIS — Z0001 Encounter for general adult medical examination with abnormal findings: Secondary | ICD-10-CM | POA: Diagnosis not present

## 2016-09-09 DIAGNOSIS — R61 Generalized hyperhidrosis: Secondary | ICD-10-CM

## 2016-09-09 LAB — LIPID PANEL
CHOL/HDL RATIO: 3
Cholesterol: 176 mg/dL (ref 0–200)
HDL: 50.5 mg/dL (ref >39.00)
LDL CALC: 106 mg/dL — AB (ref 0–99)
NonHDL: 125.78
TRIGLYCERIDES: 101 mg/dL (ref 0.0–149.0)
VLDL: 20.2 mg/dL (ref 0.0–40.0)

## 2016-09-09 LAB — CBC
HCT: 41.7 % (ref 36.0–46.0)
HEMOGLOBIN: 13.9 g/dL (ref 12.0–15.0)
MCHC: 33.5 g/dL (ref 30.0–36.0)
MCV: 92.8 fl (ref 78.0–100.0)
Platelets: 123 10*3/uL — ABNORMAL LOW (ref 150.0–400.0)
RBC: 4.49 Mil/uL (ref 3.87–5.11)
RDW: 14.1 % (ref 11.5–15.5)
WBC: 5.3 10*3/uL (ref 4.0–10.5)

## 2016-09-09 LAB — COMPREHENSIVE METABOLIC PANEL
ALT: 13 U/L (ref 0–35)
AST: 16 U/L (ref 0–37)
Albumin: 4.3 g/dL (ref 3.5–5.2)
Alkaline Phosphatase: 55 U/L (ref 39–117)
BILIRUBIN TOTAL: 0.5 mg/dL (ref 0.2–1.2)
BUN: 14 mg/dL (ref 6–23)
CO2: 28 meq/L (ref 19–32)
Calcium: 8.6 mg/dL (ref 8.4–10.5)
Chloride: 107 mEq/L (ref 96–112)
Creatinine, Ser: 0.79 mg/dL (ref 0.40–1.20)
GFR: 89.85 mL/min (ref >60.00)
GLUCOSE: 80 mg/dL (ref 70–99)
Potassium: 4.2 mEq/L (ref 3.5–5.1)
SODIUM: 139 meq/L (ref 135–145)
Total Protein: 7.2 g/dL (ref 6.0–8.3)

## 2016-09-09 LAB — TSH: TSH: 1.1 u[IU]/mL (ref 0.35–4.50)

## 2016-09-09 MED ORDER — SERTRALINE HCL 100 MG PO TABS
150.0000 mg | ORAL_TABLET | Freq: Every day | ORAL | 3 refills | Status: DC
Start: 2016-09-09 — End: 2016-10-20

## 2016-09-09 NOTE — Patient Instructions (Signed)
Health Maintenance, Female Adopting a healthy lifestyle and getting preventive care can go a long way to promote health and wellness. Talk with your health care provider about what schedule of regular examinations is right for you. This is a good chance for you to check in with your provider about disease prevention and staying healthy. In between checkups, there are plenty of things you can do on your own. Experts have done a lot of research about which lifestyle changes and preventive measures are most likely to keep you healthy. Ask your health care provider for more information. Weight and diet Eat a healthy diet  Be sure to include plenty of vegetables, fruits, low-fat dairy products, and lean protein.  Do not eat a lot of foods high in solid fats, added sugars, or salt.  Get regular exercise. This is one of the most important things you can do for your health.  Most adults should exercise for at least 150 minutes each week. The exercise should increase your heart rate and make you sweat (moderate-intensity exercise).  Most adults should also do strengthening exercises at least twice a week. This is in addition to the moderate-intensity exercise. Maintain a healthy weight  Body mass index (BMI) is a measurement that can be used to identify possible weight problems. It estimates body fat based on height and weight. Your health care provider can help determine your BMI and help you achieve or maintain a healthy weight.  For females 76 years of age and older:  A BMI below 18.5 is considered underweight.  A BMI of 18.5 to 24.9 is normal.  A BMI of 25 to 29.9 is considered overweight.  A BMI of 30 and above is considered obese. Watch levels of cholesterol and blood lipids  You should start having your blood tested for lipids and cholesterol at 32 years of age, then have this test every 5 years.  You may need to have your cholesterol levels checked more often if:  Your lipid or  cholesterol levels are high.  You are older than 32 years of age.  You are at high risk for heart disease. Cancer screening Lung Cancer  Lung cancer screening is recommended for adults 64-42 years old who are at high risk for lung cancer because of a history of smoking.  A yearly low-dose CT scan of the lungs is recommended for people who:  Currently smoke.  Have quit within the past 15 years.  Have at least a 30-pack-year history of smoking. A pack year is smoking an average of one pack of cigarettes a day for 1 year.  Yearly screening should continue until it has been 15 years since you quit.  Yearly screening should stop if you develop a health problem that would prevent you from having lung cancer treatment. Breast Cancer  Practice breast self-awareness. This means understanding how your breasts normally appear and feel.  It also means doing regular breast self-exams. Let your health care provider know about any changes, no matter how small.  If you are in your 20s or 30s, you should have a clinical breast exam (CBE) by a health care provider every 1-3 years as part of a regular health exam.  If you are 34 or older, have a CBE every year. Also consider having a breast X-ray (mammogram) every year.  If you have a family history of breast cancer, talk to your health care provider about genetic screening.  If you are at high risk for breast cancer, talk  to your health care provider about having an MRI and a mammogram every year.  Breast cancer gene (BRCA) assessment is recommended for women who have family members with BRCA-related cancers. BRCA-related cancers include:  Breast.  Ovarian.  Tubal.  Peritoneal cancers.  Results of the assessment will determine the need for genetic counseling and BRCA1 and BRCA2 testing. Cervical Cancer  Your health care provider may recommend that you be screened regularly for cancer of the pelvic organs (ovaries, uterus, and vagina).  This screening involves a pelvic examination, including checking for microscopic changes to the surface of your cervix (Pap test). You may be encouraged to have this screening done every 3 years, beginning at age 24.  For women ages 66-65, health care providers may recommend pelvic exams and Pap testing every 3 years, or they may recommend the Pap and pelvic exam, combined with testing for human papilloma virus (HPV), every 5 years. Some types of HPV increase your risk of cervical cancer. Testing for HPV may also be done on women of any age with unclear Pap test results.  Other health care providers may not recommend any screening for nonpregnant women who are considered low risk for pelvic cancer and who do not have symptoms. Ask your health care provider if a screening pelvic exam is right for you.  If you have had past treatment for cervical cancer or a condition that could lead to cancer, you need Pap tests and screening for cancer for at least 20 years after your treatment. If Pap tests have been discontinued, your risk factors (such as having a new sexual partner) need to be reassessed to determine if screening should resume. Some women have medical problems that increase the chance of getting cervical cancer. In these cases, your health care provider may recommend more frequent screening and Pap tests. Colorectal Cancer  This type of cancer can be detected and often prevented.  Routine colorectal cancer screening usually begins at 32 years of age and continues through 32 years of age.  Your health care provider may recommend screening at an earlier age if you have risk factors for colon cancer.  Your health care provider may also recommend using home test kits to check for hidden blood in the stool.  A small camera at the end of a tube can be used to examine your colon directly (sigmoidoscopy or colonoscopy). This is done to check for the earliest forms of colorectal cancer.  Routine  screening usually begins at age 41.  Direct examination of the colon should be repeated every 5-10 years through 32 years of age. However, you may need to be screened more often if early forms of precancerous polyps or small growths are found. Skin Cancer  Check your skin from head to toe regularly.  Tell your health care provider about any new moles or changes in moles, especially if there is a change in a mole's shape or color.  Also tell your health care provider if you have a mole that is larger than the size of a pencil eraser.  Always use sunscreen. Apply sunscreen liberally and repeatedly throughout the day.  Protect yourself by wearing long sleeves, pants, a wide-brimmed hat, and sunglasses whenever you are outside. Heart disease, diabetes, and high blood pressure  High blood pressure causes heart disease and increases the risk of stroke. High blood pressure is more likely to develop in:  People who have blood pressure in the high end of the normal range (130-139/85-89 mm Hg).  People who are overweight or obese.  People who are African American.  If you are 59-24 years of age, have your blood pressure checked every 3-5 years. If you are 34 years of age or older, have your blood pressure checked every year. You should have your blood pressure measured twice-once when you are at a hospital or clinic, and once when you are not at a hospital or clinic. Record the average of the two measurements. To check your blood pressure when you are not at a hospital or clinic, you can use:  An automated blood pressure machine at a pharmacy.  A home blood pressure monitor.  If you are between 29 years and 60 years old, ask your health care provider if you should take aspirin to prevent strokes.  Have regular diabetes screenings. This involves taking a blood sample to check your fasting blood sugar level.  If you are at a normal weight and have a low risk for diabetes, have this test once  every three years after 32 years of age.  If you are overweight and have a high risk for diabetes, consider being tested at a younger age or more often. Preventing infection Hepatitis B  If you have a higher risk for hepatitis B, you should be screened for this virus. You are considered at high risk for hepatitis B if:  You were born in a country where hepatitis B is common. Ask your health care provider which countries are considered high risk.  Your parents were born in a high-risk country, and you have not been immunized against hepatitis B (hepatitis B vaccine).  You have HIV or AIDS.  You use needles to inject street drugs.  You live with someone who has hepatitis B.  You have had sex with someone who has hepatitis B.  You get hemodialysis treatment.  You take certain medicines for conditions, including cancer, organ transplantation, and autoimmune conditions. Hepatitis C  Blood testing is recommended for:  Everyone born from [birth year] through 1966.  Anyone with known risk factors for hepatitis C. Sexually transmitted infections (STIs)  You should be screened for sexually transmitted infections (STIs) including gonorrhea and chlamydia if:  You are sexually active and are younger than 32 years of age.  You are older than 32 years of age and your health care provider tells you that you are at risk for this type of infection.  Your sexual activity has changed since you were last screened and you are at an increased risk for chlamydia or gonorrhea. Ask your health care provider if you are at risk.  If you do not have HIV, but are at risk, it may be recommended that you take a prescription medicine daily to prevent HIV infection. This is called pre-exposure prophylaxis (PrEP). You are considered at risk if:  You are sexually active and do not regularly use condoms or know the HIV status of your partner(s).  You take drugs by injection.  You are sexually active with a partner  who has HIV. Talk with your health care provider about whether you are at high risk of being infected with HIV. If you choose to begin PrEP, you should first be tested for HIV. You should then be tested every 3 months for as long as you are taking PrEP. Pregnancy  If you are premenopausal and you may become pregnant, ask your health care provider about preconception counseling.  If you may become pregnant, take 400 to 800 micrograms (mcg) of folic acid  every day.  If you want to prevent pregnancy, talk to your health care provider about birth control (contraception). Osteoporosis and menopause  Osteoporosis is a disease in which the bones lose minerals and strength with aging. This can result in serious bone fractures. Your risk for osteoporosis can be identified using a bone density scan.  If you are 4 years of age or older, or if you are at risk for osteoporosis and fractures, ask your health care provider if you should be screened.  Ask your health care provider whether you should take a calcium or vitamin D supplement to lower your risk for osteoporosis.  Menopause may have certain physical symptoms and risks.  Hormone replacement therapy may reduce some of these symptoms and risks. Talk to your health care provider about whether hormone replacement therapy is right for you. Follow these instructions at home:  Schedule regular health, dental, and eye exams.  Stay current with your immunizations.  Do not use any tobacco products including cigarettes, chewing tobacco, or electronic cigarettes.  If you are pregnant, do not drink alcohol.  If you are breastfeeding, limit how much and how often you drink alcohol.  Limit alcohol intake to no more than 1 drink per day for nonpregnant women. One drink equals 12 ounces of beer, 5 ounces of wine, or 1 ounces of hard liquor.  Do not use street drugs.  Do not share needles.  Ask your health care provider for help if you need support  or information about quitting drugs.  Tell your health care provider if you often feel depressed.  Tell your health care provider if you have ever been abused or do not feel safe at home. This information is not intended to replace advice given to you by your health care provider. Make sure you discuss any questions you have with your health care provider. Document Released: 10/28/2010 Document Revised: 09/20/2015 Document Reviewed: 01/16/2015 Elsevier Interactive Patient Education  2017 [employer].

## 2016-09-09 NOTE — Progress Notes (Signed)
Subjective:    Patient ID: Robin Little, female    DOB: [birth year], 31 y.o.   MRN: [id number]  HPI  Pt presents to the clinic today for her annual exam.  Flu: 12/2015 Tetanus: 06/2014 Pap Smear: 10/2015 at [facility] Dentist: as needed  Diet: She does eat meat. She consume fruits and veggies daily. She does eat fried foods. She drinks mostly Diet Pepsi, some water. Exercise: None  Review of Systems  Past Medical History:  Diagnosis Date  . Anemia   . Anxiety   . GERD (gastroesophageal reflux disease)   . History of shingles     Current Outpatient Prescriptions  Medication Sig Dispense Refill  . acetaminophen (TYLENOL) 500 MG tablet Take 1,000 mg by mouth every 6 (six) hours as needed for moderate pain.    Marland Kitchen gabapentin (NEURONTIN) 300 MG capsule Take 1 capsule (300 mg total) by mouth 2 (two) times daily as needed. For pain 40 capsule 0  . ibuprofen (ADVIL,MOTRIN) 800 MG tablet Take 1 tablet (800 mg total) by mouth every 8 (eight) hours as needed. 21 tablet 0  . levonorgestrel (MIRENA) 20 MCG/24HR IUD 1 each by Intrauterine route once. Inserted 2016    . sertraline (ZOLOFT) 100 MG tablet Take 100 mg by mouth daily.     No current facility-administered medications for this visit.     No Known Allergies  Family History  Problem Relation Age of Onset  . Hyperlipidemia Mother   . Hypertension Mother   . Arthritis Mother   . Hypothyroidism Mother   . Hyperlipidemia Father   . Hypertension Father   . Cancer Maternal Grandmother        melanoma  . Goiter Maternal Grandmother   . Cancer Paternal Grandmother        liver  . Diabetes Maternal Grandfather   . Parkinson's disease Maternal Grandfather   . Hyperlipidemia Maternal Grandfather   . Hypertension Maternal Grandfather   . Alcohol abuse Paternal Grandfather     Social History   Social History  . Marital status: Married    Spouse name: N/A  . Number of children: N/A  . Years of education: N/A    Occupational History  . Not on file.   Social History Main Topics  . Smoking status: Never Smoker  . Smokeless tobacco: Never Used  . Alcohol use Yes     Comment: social  . Drug use: No  . Sexual activity: Yes    Birth control/ protection: IUD   Other Topics Concern  . Not on file   Social History Narrative  . No narrative on file     Constitutional: Pt reports night sweats. Denies fever, malaise, fatigue, headache or abrupt weight changes.  HEENT: Denies eye pain, eye redness, ear pain, ringing in the ears, wax buildup, runny nose, nasal congestion, bloody nose, or sore throat. Respiratory: Denies difficulty breathing, shortness of breath, cough or sputum production.   Cardiovascular: Denies chest pain, chest tightness, palpitations or swelling in the hands or feet.  Gastrointestinal: Pt reports intermittent reflux. Denies abdominal pain, bloating, constipation, diarrhea or blood in the stool.  GU: Denies urgency, frequency, pain with urination, burning sensation, blood in urine, odor or discharge. Musculoskeletal: Denies decrease in range of motion, difficulty with gait, muscle pain or joint pain and swelling.  Skin: Denies redness, rashes, lesions or ulcercations.  Neurological: Denies dizziness, difficulty with memory, difficulty with speech or problems with balance and coordination.  Psych: Pt has history  of anxiety. Denies depression, SI/HI.  No other specific complaints in a complete review of systems (except as listed in HPI above).     Objective:   Physical Exam   BP 106/66   Pulse 70   Temp 98.2 F (36.8 C) (Oral)   Ht 5' 5.5" (1.664 m)   Wt 185 lb 8 oz (84.1 kg)   SpO2 98%   Breastfeeding? No   BMI 30.40 kg/m  Wt Readings from Last 3 Encounters:  09/09/16 185 lb 8 oz (84.1 kg)  08/20/16 185 lb 4 oz (84 kg)  07/14/16 181 lb 8 oz (82.3 kg)    General: Appears her stated age, obese in NAD. Skin: Warm, dry and intact.  HEENT: Head: normal shape and  size; Eyes: sclera white, no icterus, conjunctiva pink, PERRLA and EOMs intact; Ears: Tm's gray and intact, normal light reflex; Throat/Mouth: Teeth present, mucosa pink and moist, no exudate, lesions or ulcerations noted.  Neck:  Neck supple, trachea midline. No masses, lumps present. Thyromegaly noted. Cardiovascular: Normal rate and rhythm. S1,S2 noted.  No murmur, rubs or gallops noted. No JVD or BLE edema.  Pulmonary/Chest: Normal effort and positive vesicular breath sounds. No respiratory distress. No wheezes, rales or ronchi noted.  Abdomen: Soft and nontender. Normal bowel sounds. No distention or masses noted. Liver, spleen and kidneys non palpable. Musculoskeletal: strength 5/5 BUE/BLE.  No difficulty with gait.  Neurological: Alert and oriented. Cranial nerves II-XII grossly intact. Coordination normal.  Psychiatric: Mood and affect normal. Behavior is normal. Judgment and thought content normal.    BMET    Component Value Date/Time   NA 139 03/22/2016 0419   K 3.7 03/22/2016 0419   CL 108 03/22/2016 0419   CO2 26 03/22/2016 0419   GLUCOSE 97 03/22/2016 0419   BUN 15 03/22/2016 0419   CREATININE 0.76 03/22/2016 0419   CALCIUM 8.2 (L) 03/22/2016 0419   GFRNONAA >60 03/22/2016 0419   GFRAA >60 03/22/2016 0419    Lipid Panel  No results found for: CHOL, TRIG, HDL, CHOLHDL, VLDL, LDLCALC  CBC    Component Value Date/Time   WBC 5.4 03/22/2016 0419   RBC 4.28 03/22/2016 0419   HGB 12.9 03/22/2016 0419   HCT 39.8 03/22/2016 0419   PLT 116 (L) 03/22/2016 0419   MCV 93.0 03/22/2016 0419   MCH 30.1 03/22/2016 0419   MCHC 32.4 03/22/2016 0419   RDW 13.2 03/22/2016 0419    Hgb A1C No results found for: HGBA1C         Assessment & Plan:   Preventative Health Maintenance:  Flu and tetanus UTD Pap smear UTD Encouraged her to consume a balanced diet and start an exercise regimen Advised her to see a dentist at least annually Will check CBC, CMET, Lipid and TSH  today  Thyromegaly:  TSH today  GERD:  Avoid foods that trigger your reflux Discussed how weight loss may help improve your reflux  Night Sweats:  Apply deodorant to flexural areas before bed  RTC in 1 year, sooner if needed Nicki ReaperBAITY, Kysha Muralles, NP

## 2016-09-23 ENCOUNTER — Encounter: Payer: Self-pay | Admitting: Internal Medicine

## 2016-09-24 DIAGNOSIS — N760 Acute vaginitis: Secondary | ICD-10-CM | POA: Diagnosis not present

## 2016-09-24 MED FILL — FLUCONAZOLE 150 MG TABLET: 150 | 1 days supply | Qty: 1 | Fill #0

## 2016-10-14 ENCOUNTER — Ambulatory Visit (HOSPITAL_COMMUNITY)
Admission: EM | Admit: 2016-10-14 | Discharge: 2016-10-14 | Disposition: A | Discharge status: Home / Self Care | Payer: 59 | Attending: Family Medicine | Admitting: Family Medicine

## 2016-10-14 ENCOUNTER — Encounter (HOSPITAL_COMMUNITY): Payer: Self-pay | Admitting: Family Medicine

## 2016-10-14 DIAGNOSIS — J039 Acute tonsillitis, unspecified: Secondary | ICD-10-CM

## 2016-10-14 MED ORDER — LIDOCAINE VISCOUS 2 % MT SOLN: OROMUCOSAL | 0 refills | Status: DC

## 2016-10-14 MED ORDER — AMOXICILLIN 875 MG PO TABS: 875.0000 mg | ORAL_TABLET | Freq: Two times a day (BID) | ORAL | 0 refills | Status: DC

## 2016-10-14 NOTE — ED Triage Notes (Signed)
Pt here for sore throat x 2 days 

## 2016-10-15 NOTE — ED Provider Notes (Signed)
CSN: [id number]     Arrival date & time 10/14/16  1813 History   None    Chief Complaint  Patient presents with  . Sore Throat   (Consider location/radiation/quality/duration/timing/severity/associated sxs/prior Treatment) Patient c/o sore throat for 2 days   The history is provided by the patient.  Sore Throat  This is a new problem. The problem occurs constantly. Nothing aggravates the symptoms.    Past Medical History:  Diagnosis Date  . Anemia   . Anxiety   . GERD (gastroesophageal reflux disease)   . History of shingles    Past Surgical History:  Procedure Laterality Date  . NO PAST SURGERIES     Family History  Problem Relation Age of Onset  . Hyperlipidemia Mother   . Hypertension Mother   . Arthritis Mother   . Hypothyroidism Mother   . Hyperlipidemia Father   . Hypertension Father   . Cancer Maternal Grandmother        melanoma  . Goiter Maternal Grandmother   . Cancer Paternal Grandmother        liver  . Diabetes Maternal Grandfather   . Parkinson's disease Maternal Grandfather   . Hyperlipidemia Maternal Grandfather   . Hypertension Maternal Grandfather   . Alcohol abuse Paternal Grandfather    Social History  Substance Use Topics  . Smoking status: Never Smoker  . Smokeless tobacco: Never Used  . Alcohol use Yes     Comment: social   OB History    Gravida Para Term Preterm AB Living   2 2 2     2    SAB TAB Ectopic Multiple Live Births         0 2     Review of Systems  Constitutional: Negative.   Eyes: Negative.   Respiratory: Negative.   Cardiovascular: Negative.   Gastrointestinal: Negative.   Endocrine: Negative.   Genitourinary: Negative.   Musculoskeletal: Negative.   Allergic/Immunologic: Negative.   Neurological: Negative.   Hematological: Negative.   Psychiatric/Behavioral: Negative.     Allergies  Patient has no known allergies.  Home Medications   Prior to Admission medications   Medication Sig Start Date End Date  Taking? Authorizing Provider  acetaminophen (TYLENOL) 500 MG tablet Take 1,000 mg by mouth every 6 (six) hours as needed for moderate pain.    [provider]  amoxicillin (AMOXIL) 875 MG tablet Take 1 tablet (875 mg total) by mouth 2 (two) times daily. 10/14/16   Deatra Canterxford, Rachana Malesky J, FNP  gabapentin (NEURONTIN) 300 MG capsule Take 1 capsule (300 mg total) by mouth 2 (two) times daily as needed. For pain 08/19/16   Withrow, Everardo AllJohn C, FNP  ibuprofen (ADVIL,MOTRIN) 800 MG tablet Take 1 tablet (800 mg total) by mouth every 8 (eight) hours as needed. 03/22/16   Ward, Chase PicketJaime Pilcher, PA-C  levonorgestrel (MIRENA) 20 MCG/24HR IUD 1 each by Intrauterine route once. Inserted 2016    [provider]  lidocaine (XYLOCAINE) 2 % solution Take 10 ml po q 3 hours prn throat pain 10/14/16   Deatra Canterxford, Antoinette Borgwardt J, FNP  sertraline (ZOLOFT) 100 MG tablet Take 1.5 tablets (150 mg total) by mouth daily. 09/09/16   Lorre MunroeBaity, Regina W, NP   Meds Ordered and Administered this Visit  Medications - No data to display  BP 122/85   Pulse 69   Temp 98.4 F (36.9 C)   Resp 18   SpO2 100%  No data found.   Physical Exam  Constitutional: She is oriented to  person, place, and time. She appears well-developed and well-nourished.  HENT:  Head: Normocephalic and atraumatic.  Eyes: Conjunctivae and EOM are normal. Pupils are equal, round, and reactive to light.  Neck: Normal range of motion. Neck supple.  Cardiovascular: Normal rate, regular rhythm and normal heart sounds.   Pulmonary/Chest: Effort normal and breath sounds normal.  Abdominal: Soft. Bowel sounds are normal.  Musculoskeletal: Normal range of motion.  Neurological: She is alert and oriented to person, place, and time.  Nursing note and vitals reviewed.   Urgent Care Course     Procedures (including critical care time)  Labs Review Labs Reviewed - No data to display  Imaging Review No results found.   Visual Acuity Review  Right Eye  Distance:   Left Eye Distance:   Bilateral Distance:    Right Eye Near:   Left Eye Near:    Bilateral Near:         MDM   1. Tonsillitis    Amoxicillin 875mg  one po bidx 10 days #20     Deatra Canter, [place] 10/15/16 1518

## 2016-10-18 ENCOUNTER — Encounter: Payer: Self-pay | Admitting: Internal Medicine

## 2016-10-20 MED ORDER — SERTRALINE HCL 100 MG PO TABS: 150.0000 mg | ORAL_TABLET | Freq: Every day | ORAL | 3 refills | Status: DC

## 2016-10-20 MED FILL — SERTRALINE HCL 100 MG TAB: 100 | 90 days supply | Qty: 135 | Fill #0

## 2016-10-20 MED FILL — FLUCONAZOLE 150 MG TABLET: 150 | 1 days supply | Qty: 1 | Fill #0

## 2016-10-20 NOTE — Telephone Encounter (Signed)
Rx has been resent 

## 2016-10-20 NOTE — Telephone Encounter (Signed)
Pt left /vm requesting cb [phone]; pt is trying to ck on refill of zoloft 150 mg daily for 3 month supply. Please advise.

## 2016-11-03 DIAGNOSIS — H52223 Regular astigmatism, bilateral: Secondary | ICD-10-CM | POA: Diagnosis not present

## 2016-11-03 DIAGNOSIS — H5202 Hypermetropia, left eye: Secondary | ICD-10-CM | POA: Diagnosis not present

## 2016-12-08 DIAGNOSIS — Z30432 Encounter for removal of intrauterine contraceptive device: Secondary | ICD-10-CM | POA: Diagnosis not present

## 2016-12-08 DIAGNOSIS — Z01419 Encounter for gynecological examination (general) (routine) without abnormal findings: Secondary | ICD-10-CM | POA: Diagnosis not present

## 2016-12-08 DIAGNOSIS — Z6830 Body mass index (BMI) 30.0-30.9, adult: Secondary | ICD-10-CM | POA: Diagnosis not present

## 2016-12-08 MED FILL — XULANE PATCH: 150-35 | 28 days supply | Qty: 3 | Fill #0

## 2016-12-17 ENCOUNTER — Encounter: Payer: Self-pay | Admitting: Internal Medicine

## 2016-12-17 MED ORDER — FLUCONAZOLE 150 MG PO TABS: 150.0000 mg | ORAL_TABLET | Freq: Once | ORAL | 0 refills | Status: AC

## 2017-01-06 MED FILL — XULANE PATCH: 150-35 | 28 days supply | Qty: 3 | Fill #1

## 2017-01-22 ENCOUNTER — Telehealth: Payer: Self-pay | Admitting: Internal Medicine

## 2017-01-22 MED FILL — SERTRALINE HCL 100 MG TAB: 100 | 90 days supply | Qty: 135 | Fill #1

## 2017-01-22 NOTE — Telephone Encounter (Signed)
PLEASE NOTE: All timestamps contained within this report are represented as [place] Standard Time. CONFIDENTIALTY NOTICE: This fax transmission is intended only for the addressee. It contains information that is legally privileged, confidential or otherwise protected from use or disclosure. If you are not the intended recipient, you are strictly prohibited from reviewing, disclosing, copying using or disseminating any of this information or taking any action in reliance on or regarding this information. If you have received this fax in error, please notify us immediately by telephone so that we can arrange for its return to [place]. Phone: [phone], Toll-Free: [phone], Fax: [phone] Page: 1 of 2 Call Id: [id number] [facility] Day - Client TELEPHONE ADVICE RECORD [facility] Patient Name: Robin Little Gender: Female DOB: Aug 13, 1985 Age: 31 Y 2 D Return Phone Number: [phone] (Primary) Address: City/State/Zip: [place] [address] Client [facility] Day - Client Client Site [facility] - Day Physician Nicki Reaper - NP Contact Type Call Who Is Calling Patient / Member / Family / Caregiver Call Type Triage / Clinical Relationship To Patient Self Return Phone Number [phone] (Primary) Chief Complaint Heart palpitations or irregular heartbeat Reason for Call Symptomatic / Request for Health Information Initial Comment Caller states she has been having heart palpitations for a week. No chest pain. Translation No Nurse Assessment Nurse: Milana Na, RN, Donnamarie Poag Date/Time (Eastern Time): 01/22/2017 9:02:17 AM Confirm and document reason for call. If symptomatic, describe symptoms. ---Caller states she has been feeling her heart pounding out of her chest for about a week and a half sporadically. Denies chest pain dizziness, nausea vomiting sweating or pain in jaw or arms. She states she has tried  resting and that does not change it. HR 81 currently. Does the patient have any new or worsening symptoms? ---Yes Will a triage be completed? ---Yes Related visit to physician within the last 2 weeks? ---No Does the PT have any chronic conditions? (i.e. diabetes, asthma, etc.) ---No Is the patient pregnant or possibly pregnant? (Ask all females between the ages of 35-55) ---No Is this a behavioral health or substance abuse call? ---No Guidelines Guideline Title Affirmed Question Affirmed Notes Nurse Date/Time Lamount Cohen Time) Heart Rate and Heartbeat Questions Palpitations Milana Na, RN, Donnamarie Poag 01/22/2017 9:06:59 AM Disp. Time Lamount Cohen Time) Disposition Final User 01/22/2017 9:15:38 AM Send To RN Personal Milana Na, RN, Donnamarie Poag 01/22/2017 9:12:14 AM Home Care Yes Milana Na, RN, Donnamarie Poag PLEASE NOTE: All timestamps contained within this report are represented as [place] Standard Time. CONFIDENTIALTY NOTICE: This fax transmission is intended only for the addressee. It contains information that is legally privileged, confidential or otherwise protected from use or disclosure. If you are not the intended recipient, you are strictly prohibited from reviewing, disclosing, copying using or disseminating any of this information or taking any action in reliance on or regarding this information. If you have received this fax in error, please notify us immediately by telephone so that we can arrange for its return to [place]. Phone: [phone], Toll-Free: [phone], Fax: [phone] Page: 2 of 2 Call Id: [id number] Caller Disagree/Comply Comply Caller Understands Yes PreDisposition Call Doctor Care Advice Given Per Guideline LIMIT ALCOHOL: Limit your alcohol consumption to no more than 2 drinks a day. Ideally, eliminate alcohol entirely for the next two weeks. * Examples include coffee, tea, colas, [address] [employer], and some 'energy drinks'. * Avoid caffeine-containing beverages (Reason:  caffeine is a stimulant and can aggravate palpitations). AVOID CAFFEINE: * Drink adequate liquids - 6-8  glasses of water daily. * Eat a balanced healthy diet. * Regular exercise will improve your overall health, improve your mood, and is a simple method to reduce stress. * Sleep - Try to get sufficient amount of sleep. Lack of sleep can aggravate palpitations. Most people need 7-8 hours of sleep each night. REASSURANCE AND EDUCATION: Everybody experiences palpitations at some point in their lives. In many circumstances it is simply a heightened awareness of the heart's normal beating. Patients with anxiety or stress may describe a 'rapid heart beat' or 'pounding' in their chest from their heart beating. HOME CARE: You should be able to treat this at home. CALL BACK IF: * Chest pain, lightheadedness or difficulty breathing occurs * Heart beating over 140 beats / minute * over 3 extra or skipped beats / minute * You become worse. CARE ADVICE given per Palpitations (Adult) guideline. HEALTH BASICS:

## 2017-01-22 NOTE — Telephone Encounter (Signed)
Patient Name: Robin Little DOB: [birth year] Initial Comment Caller states she has been having heart palpitations for a week. No chest pain. Nurse Assessment Nurse: Milana Na, RN, Donnamarie Poag Date/Time (Eastern Time): 01/22/2017 9:02:17 AM Confirm and document reason for call. If symptomatic, describe symptoms. ---Caller states she has been feeling her heart pounding out of her chest for about a week and a half sporadically. Denies chest pain dizziness, nausea vomiting sweating or pain in jaw or arms. She states she has tried resting and that does not change it. HR 81 currently. Does the patient have any new or worsening symptoms? ---Yes Will a triage be completed? ---Yes Related visit to physician within the last 2 weeks? ---No Does the PT have any chronic conditions? (i.e. diabetes, asthma, etc.) ---No Is the patient pregnant or possibly pregnant? (Ask all females between the ages of 81-55) ---No Is this a behavioral health or substance abuse call? ---No Guidelines Guideline Title Affirmed Question Affirmed Notes Heart Rate and Heartbeat Questions Palpitations Final Disposition User Home Care Rose, RN, Donnamarie Poag Caller Disagree/Comply Comply Caller Understands Yes PreDisposition Call Doctor

## 2017-01-22 NOTE — Telephone Encounter (Signed)
Pt has appt with you at 4:15 30 min

## 2017-01-22 NOTE — Telephone Encounter (Signed)
She can schedule an appt to be seen

## 2017-01-23 ENCOUNTER — Ambulatory Visit: Payer: 59 | Admitting: Internal Medicine

## 2017-01-26 ENCOUNTER — Ambulatory Visit (INDEPENDENT_AMBULATORY_CARE_PROVIDER_SITE_OTHER): Payer: 59 | Admitting: Internal Medicine

## 2017-01-26 ENCOUNTER — Encounter: Payer: Self-pay | Admitting: Internal Medicine

## 2017-01-26 ENCOUNTER — Ambulatory Visit: Payer: 59 | Admitting: Internal Medicine

## 2017-01-26 VITALS — BP 110/68 | HR 67 | Temp 97.9°F | Wt 191.0 lb

## 2017-01-26 DIAGNOSIS — R002 Palpitations: Secondary | ICD-10-CM | POA: Diagnosis not present

## 2017-01-26 DIAGNOSIS — F419 Anxiety disorder, unspecified: Secondary | ICD-10-CM | POA: Diagnosis not present

## 2017-01-26 LAB — TSH: TSH: 2.08 u[IU]/mL (ref 0.35–4.50)

## 2017-01-26 LAB — BASIC METABOLIC PANEL
BUN: 11 mg/dL (ref 6–23)
CHLORIDE: 103 meq/L (ref 96–112)
CO2: 28 meq/L (ref 19–32)
CREATININE: 0.66 mg/dL (ref 0.40–1.20)
Calcium: 8.6 mg/dL (ref 8.4–10.5)
GFR: 110.3 mL/min (ref >60.00)
Glucose, Bld: 77 mg/dL (ref 70–99)
POTASSIUM: 4.3 meq/L (ref 3.5–5.1)
Sodium: 139 mEq/L (ref 135–145)

## 2017-01-26 LAB — MAGNESIUM: MAGNESIUM: 1.9 mg/dL (ref 1.5–2.5)

## 2017-01-26 NOTE — Progress Notes (Signed)
Subjective:    Patient ID: Robin Little, female    DOB: 05-02-1985, 31 y.o.   MRN: [id number]  HPI  Pt presents to the clinic today with c/o palpitations. She reports this started 2 weeks ago. It has been intermittent, but she reports it has been lasting for longer periods of time. Nothing she does makes it occur. It goes away on it's own. She did have 1 episode that lasted for about 45 minutes. She denies associated dizziness, chest pain or shortness of breath. She does have a history of anxiety, controlled on Zoloft. She does not feel like she has been more anxious or stressed lately. She has tried to cut out caffeine lately and reports that has not made a difference in her symptoms.  Review of Systems      Past Medical History:  Diagnosis Date  . Anemia   . Anxiety   . GERD (gastroesophageal reflux disease)   . History of shingles     Current Outpatient Prescriptions  Medication Sig Dispense Refill  . acetaminophen (TYLENOL) 500 MG tablet Take 1,000 mg by mouth every 6 (six) hours as needed for moderate pain.    Marland Kitchen sertraline (ZOLOFT) 100 MG tablet Take 1.5 tablets (150 mg total) by mouth daily. 135 tablet 3  . XULANE 150-35 MCG/24HR transdermal patch APPLY 1 PATCH WEEKLY AS DIRECTED  11   No current facility-administered medications for this visit.     No Known Allergies  Family History  Problem Relation Age of Onset  . Hyperlipidemia Mother   . Hypertension Mother   . Arthritis Mother   . Hypothyroidism Mother   . Hyperlipidemia Father   . Hypertension Father   . Cancer Maternal Grandmother        melanoma  . Goiter Maternal Grandmother   . Cancer Paternal Grandmother        liver  . Diabetes Maternal Grandfather   . Parkinson's disease Maternal Grandfather   . Hyperlipidemia Maternal Grandfather   . Hypertension Maternal Grandfather   . Alcohol abuse Paternal Grandfather     Social History   Social History  . Marital status: Married    Spouse name:  N/A  . Number of children: N/A  . Years of education: N/A   Occupational History  . Not on file.   Social History Main Topics  . Smoking status: Never Smoker  . Smokeless tobacco: Never Used  . Alcohol use Yes     Comment: social  . Drug use: No  . Sexual activity: Yes    Birth control/ protection: IUD   Other Topics Concern  . Not on file   Social History Narrative  . No narrative on file     Constitutional: Denies fever, malaise, fatigue, headache or abrupt weight changes.  Cardiovascular: Pt reports palpitations. Denies chest pain, chest tightness, or swelling in the hands or feet.  Psych: Pt reports anxiety. Denies depression, SI/HI.  No other specific complaints in a complete review of systems (except as listed in HPI above).  Objective:   Physical Exam  BP 110/68   Pulse 67   Temp 97.9 F (36.6 C) (Oral)   Wt 191 lb (86.6 kg)   LMP 01/05/2017 Comment: Patch  SpO2 98%   BMI 31.30 kg/m  Wt Readings from Last 3 Encounters:  01/26/17 191 lb (86.6 kg)  09/09/16 185 lb 8 oz (84.1 kg)  08/20/16 185 lb 4 oz (84 kg)    General: Appears her stated age,obese in  NAD. Neck:  Neck supple, trachea midline. No masses, lumps  present.  Cardiovascular: Normal rate and rhythm. S1,S2 noted.  No murmur, rubs or gallops noted. Neurological: Alert and oriented.  Psychiatric: Mood and affect normal. Behavior is normal. Judgment and thought content normal.     BMET    Component Value Date/Time   NA 139 09/09/2016 0829   K 4.2 09/09/2016 0829   CL 107 09/09/2016 0829   CO2 28 09/09/2016 0829   GLUCOSE 80 09/09/2016 0829   BUN 14 09/09/2016 0829   CREATININE 0.79 09/09/2016 0829   CALCIUM 8.6 09/09/2016 0829   GFRNONAA >60 03/22/2016 0419   GFRAA >60 03/22/2016 0419    Lipid Panel     Component Value Date/Time   CHOL 176 09/09/2016 0829   TRIG 101.0 09/09/2016 0829   HDL 50.50 09/09/2016 0829   CHOLHDL 3 09/09/2016 0829   VLDL 20.2 09/09/2016 0829   LDLCALC  106 (H) 09/09/2016 0829    CBC    Component Value Date/Time   WBC 5.3 09/09/2016 0829   RBC 4.49 09/09/2016 0829   HGB 13.9 09/09/2016 0829   HCT 41.7 09/09/2016 0829   PLT 123.0 (L) 09/09/2016 0829   MCV 92.8 09/09/2016 0829   MCH 30.1 03/22/2016 0419   MCHC 33.5 09/09/2016 0829   RDW 14.1 09/09/2016 0829    Hgb A1C No results found for: HGBA1C          Assessment & Plan:   Palpitations:  Exam benign ECG normal Cutting out caffeine not effective ? Anxiety related Will check TSH, BMET and magnesium If labs normal, consider 1- referral for CBT, 2- increasing Zoloft, 3- referral to cardiology for Holter  Will follow up after labs, return precautions discussed Nicki Reaper, NP

## 2017-01-26 NOTE — Patient Instructions (Signed)
Palpitations A palpitation is the feeling that your heart:  Has an uneven (irregular) heartbeat.  Is beating faster than normal.  Is fluttering.  Is skipping a beat.  This is usually not a serious problem. In some cases, you may need more medical tests. Follow these instructions at home:  Avoid: ? Caffeine in coffee, tea, soft drinks, diet pills, and energy drinks. ? Chocolate. ? Alcohol.  Do not use any tobacco products. These include cigarettes, chewing tobacco, and e-cigarettes. If you need help quitting, ask your doctor.  Try to reduce your stress. These things may help: ? Yoga. ? Meditation. ? Physical activity. Swimming, jogging, and walking are good choices. ? A method that helps you use your mind to control things in your body, like heartbeats (biofeedback).  Get plenty of rest and sleep.  Take over-the-counter and prescription medicines only as told by your doctor.  Keep all follow-up visits as told by your doctor. This is important. Contact a doctor if:  Your heartbeat is still fast or uneven after 24 hours.  Your palpitations occur more often. Get help right away if:  You have chest pain.  You feel short of breath.  You have a very bad headache.  You feel dizzy.  You pass out (faint). This information is not intended to replace advice given to you by your health care provider. Make sure you discuss any questions you have with your health care provider. Document Released: 01/22/2008 Document Revised: 09/20/2015 Document Reviewed: 12/28/2014 Elsevier Interactive Patient Education  2018 [employer]  

## 2017-01-27 ENCOUNTER — Encounter: Payer: Self-pay | Admitting: Internal Medicine

## 2017-01-27 ENCOUNTER — Other Ambulatory Visit: Payer: Self-pay | Admitting: Internal Medicine

## 2017-01-27 MED ORDER — SERTRALINE HCL 100 MG PO TABS: 200.0000 mg | ORAL_TABLET | Freq: Every day | ORAL | 1 refills | Status: DC

## 2017-01-28 MED FILL — XULANE PATCH: 150-35 | 84 days supply | Qty: 9 | Fill #2

## 2017-02-06 IMAGING — US US ART/VEN ABD/PELV/SCROTUM DOPPLER LTD
1 series · 13 of 25 positions shown · non-contrast
Comparison: 03/22/2016 unenhanced CT abdomen/pelvis.

CLINICAL DATA: 30-year-old female with severe right lower quadrant
pain since [DATE] a.m. today. LMP 03/20/2016. Mirena IUD placed October 2014. Complex right adnexal cyst on CT study from earlier today.

EXAM:
TRANSABDOMINAL AND TRANSVAGINAL ULTRASOUND OF PELVIS
DOPPLER ULTRASOUND OF OVARIES
TECHNIQUE: Both transabdominal and transvaginal ultrasound examinations of the
pelvis were performed. Transabdominal technique was performed for
global imaging of the pelvis including uterus, ovaries, adnexal
regions, and pelvic cul-de-sac.
It was necessary to proceed with endovaginal exam following the
transabdominal exam to visualize the endometrium and adnexa. Color
and duplex Doppler ultrasound was utilized to evaluate blood flow to
the ovaries.

[Series 1: us art/ven abd/pelv/scrotum doppler ltd · 0.22mm/px · 13 of 192 slices shown]
[im 1/192]
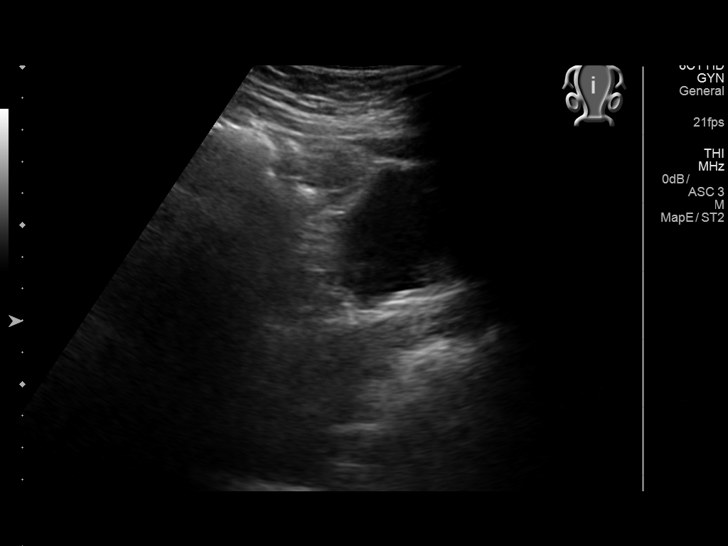
[im 16/192]
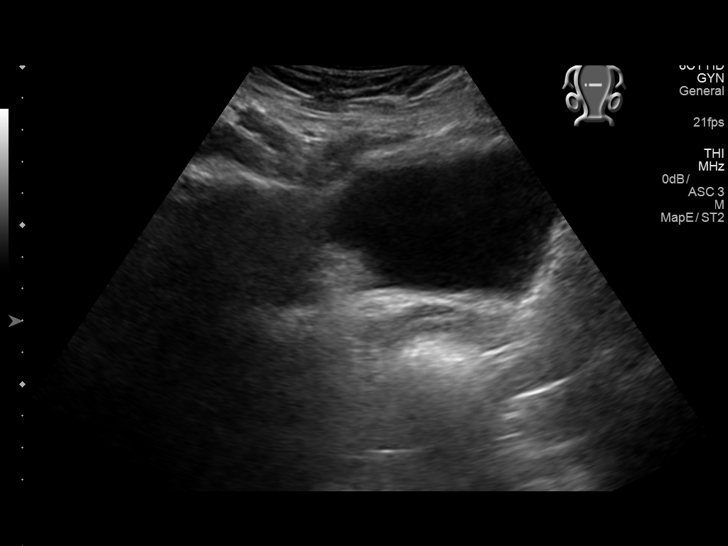
[im 32/192]
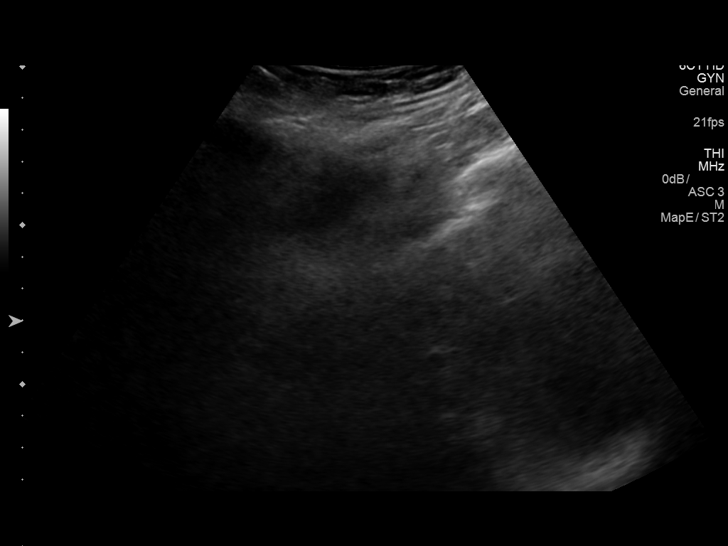
[im 48/192]
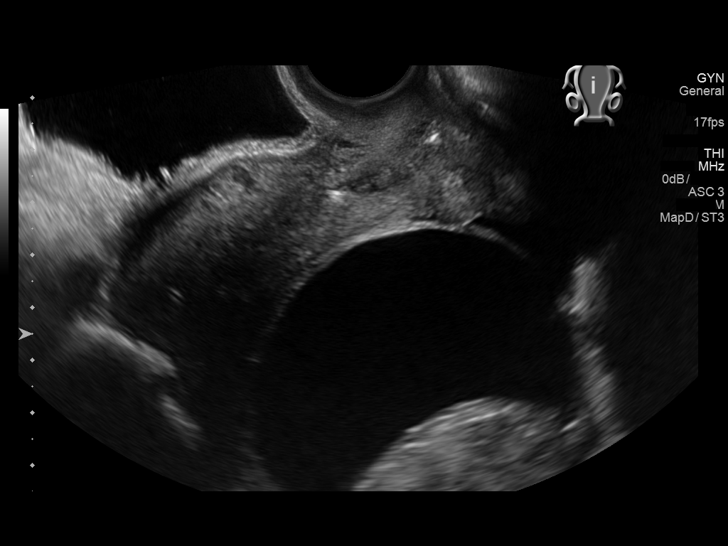
[im 64/192]
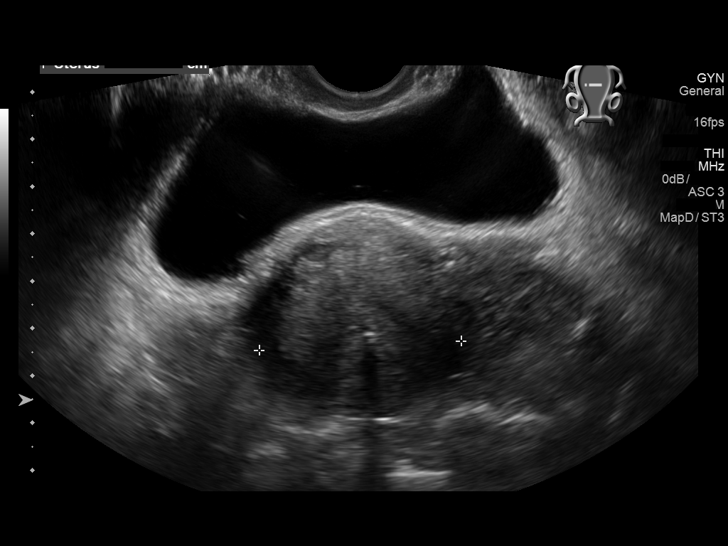
[im 80/192]
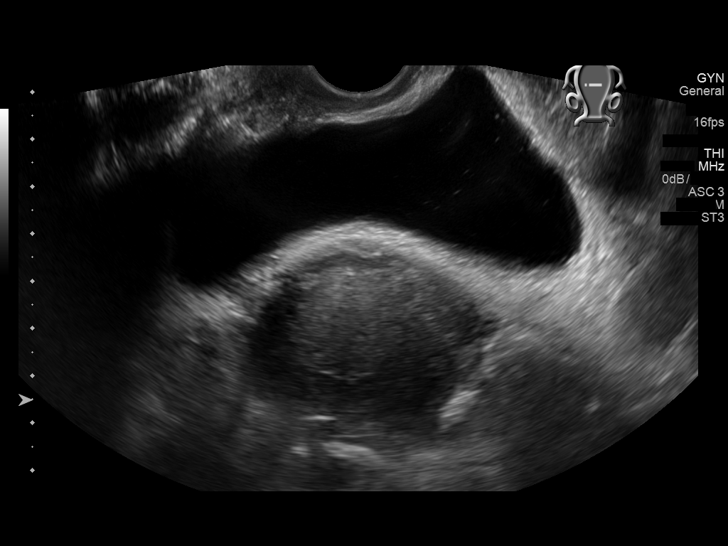
[im 96/192]
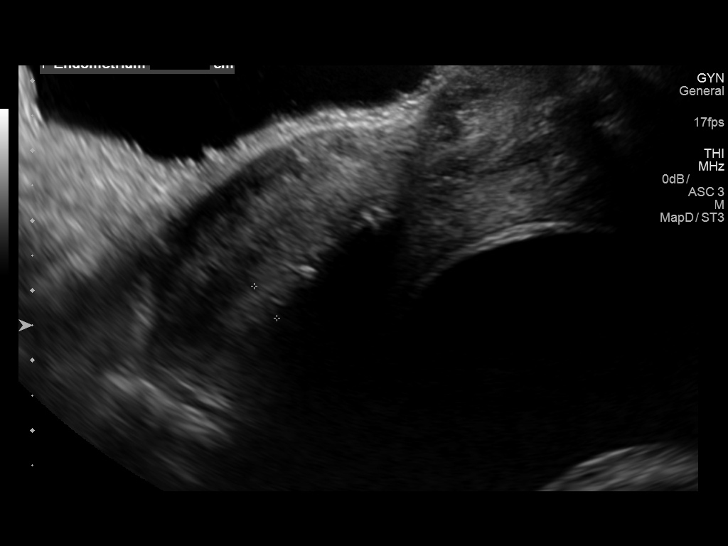
[im 112/192]
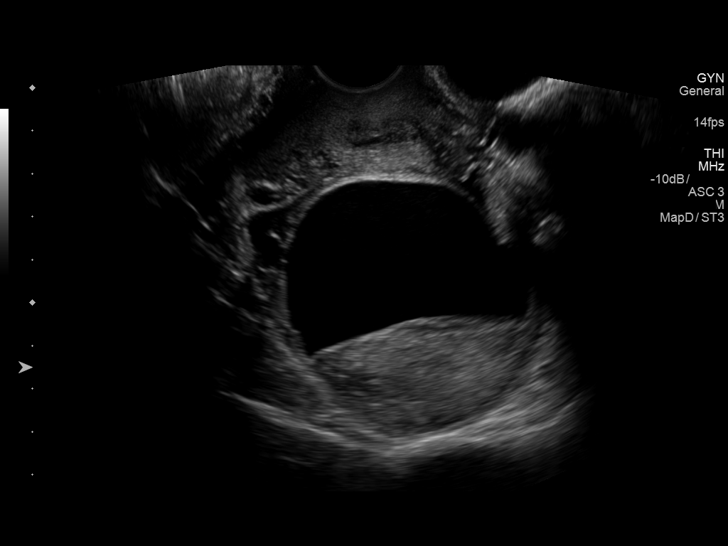
[im 128/192]
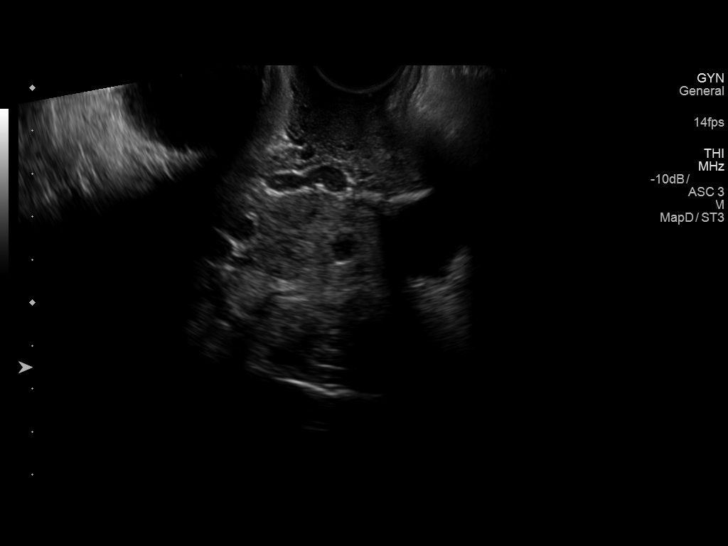
[im 144/192]
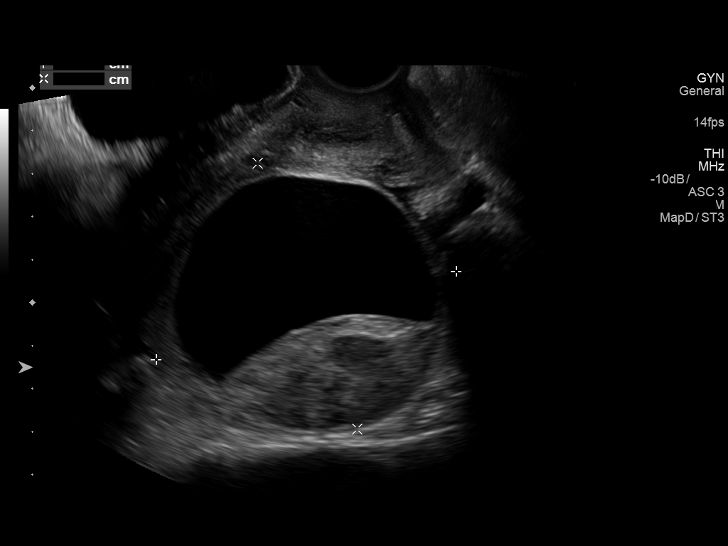
[im 160/192]
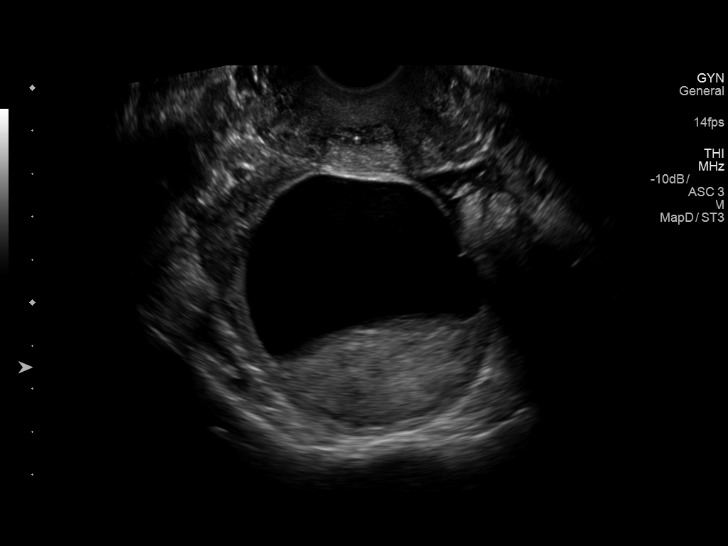
[im 176/192]
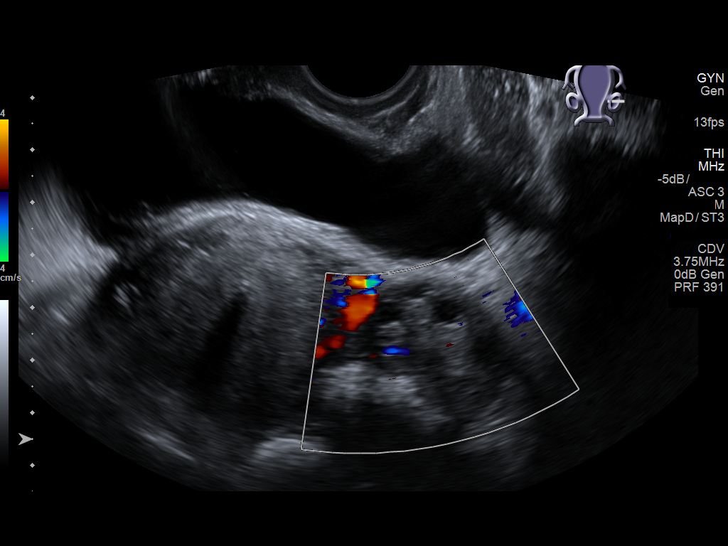
[im 192/192]
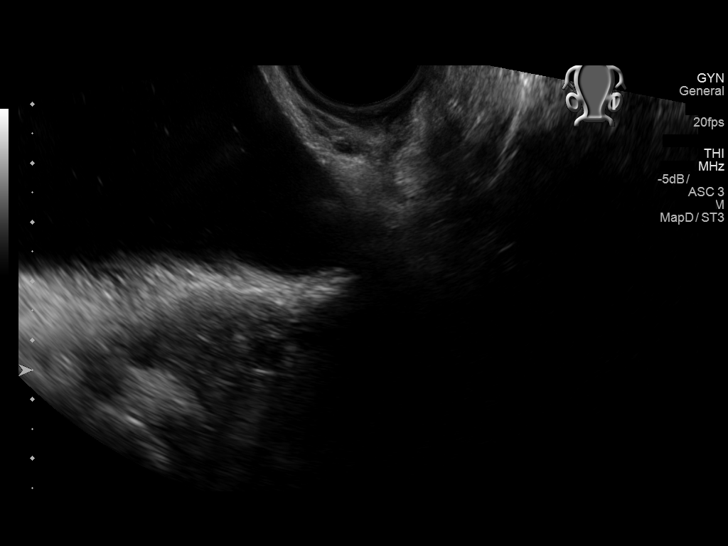

[13 of 25 positions shown; findings below may reference images not displayed]

FINDINGS: Uterus

Measurements: 7.9 x 3.4 x 4.3 cm. Anteverted anteflexed uterus is
normal in size and configuration, with no uterine fibroids or other
myometrial abnormalities.

Endometrium

Thickness: 6 mm. Intrauterine device appears grossly well-positioned
within the endometrial cavity on limited views. Endometrial
evaluation is limited by the presence of the IUD. No endometrial
cavity fluid or focal endometrial mass demonstrated.

Right ovary

Measurements: 7.1 x 6.4 x 6.4 cm. Right ovary contains a complex
x 6.0 x 5.9 cm cyst with eccentric heterogeneous solid component
morphologically resembling retractile clot, with no definite
internal vascularity, most consistent with a large hemorrhagic right
ovarian cyst.

Left ovary

Measurements: 3.7 x 3.3 x 2.4 cm. Normal appearance/no adnexal mass.

Pulsed Doppler evaluation of both ovaries demonstrates normal
low-resistance arterial and venous waveforms.

Other findings

Small volume free fluid in the pelvis.
IMPRESSION: 1. No evidence of adnexal torsion.
2. Complex 6.8 cm right ovarian cyst most consistent with a large
hemorrhagic cyst. Follow-up transvaginal pelvic ultrasound is
advised in 6-12 weeks to document resolution, given the large size.
This recommendation follows the consensus statement: Management of
Asymptomatic Ovarian and Other Adnexal Cysts Imaged at US: Society
of Radiologists in Ultrasound Consensus Conference Statement.
3. Normal anteverted uterus. No uterine fibroids. IUD appears
grossly well-positioned within the endometrial cavity.
4. Small volume free fluid in the pelvis.

## 2017-02-18 ENCOUNTER — Encounter: Payer: Self-pay | Admitting: Internal Medicine

## 2017-04-07 ENCOUNTER — Encounter: Payer: Self-pay | Admitting: Internal Medicine

## 2017-04-07 ENCOUNTER — Telehealth: Payer: 59 | Admitting: Family

## 2017-04-07 DIAGNOSIS — R05 Cough: Secondary | ICD-10-CM | POA: Diagnosis not present

## 2017-04-07 DIAGNOSIS — R059 Cough, unspecified: Secondary | ICD-10-CM

## 2017-04-07 MED ORDER — PREDNISONE 10 MG (21) PO TBPK
ORAL_TABLET | ORAL | 0 refills | Status: DC
Start: 2017-04-07 — End: 2017-09-11

## 2017-04-07 MED FILL — predniSONE 10 MG TABS: 10 | 6 days supply | Qty: 21 | Fill #0

## 2017-04-07 NOTE — Progress Notes (Signed)

## 2017-04-08 MED ORDER — SERTRALINE HCL 100 MG PO TABS: 200.0000 mg | ORAL_TABLET | Freq: Every day | ORAL | 0 refills | Status: DC

## 2017-04-08 MED FILL — SERTRALINE HCL 100 MG TAB: 100 | 90 days supply | Qty: 180 | Fill #0

## 2017-04-17 ENCOUNTER — Telehealth: Payer: 59 | Admitting: Nurse Practitioner

## 2017-04-17 DIAGNOSIS — R197 Diarrhea, unspecified: Secondary | ICD-10-CM

## 2017-04-17 NOTE — Progress Notes (Signed)
We are sorry that you are not feeling well.  Here is how we plan to help!  Based on what you have shared with me it looks like you have Acute Infectious Diarrhea.  Most cases of acute diarrhea are due to infections with virus and bacteria and are self-limited conditions lasting less than 14 days.  For your symptoms you may take Imodium 2 mg tablets that are over the counter at your local pharmacy. Take two tablet now and then one after each loose stool up to 6 a day.  Antibiotics are not needed for most people with diarrhea.   HOME CARE  We recommend changing your diet to help with your symptoms for the next few days.  Drink plenty of fluids that contain water salt and sugar. Sports drinks such as Gatorade may help.   You may try broths, soups, bananas, applesauce, soft breads, mashed potatoes or crackers.   You are considered infectious for as long as the diarrhea continues. Hand washing or use of alcohol based hand sanitizers is recommend.  It is best to stay out of work or school until your symptoms stop.   GET HELP RIGHT AWAY  If you have dark yellow colored urine or do not pass urine frequently you should drink more fluids.    If your symptoms worsen   If you feel like you are going to pass out (faint)  You have a new problem  MAKE SURE YOU   Understand these instructions.  Will watch your condition.  Will get help right away if you are not doing well or get worse.  Your e-visit answers were reviewed by a board certified advanced clinical practitioner to complete your personal care plan.  Depending on the condition, your plan could have included both over the counter or prescription medications.  If there is a problem please reply  once you have received a response from your provider.  Your safety is important to us.  If you have drug allergies check your prescription carefully.    You can use MyChart to ask questions about today's visit, request a non-urgent call  back, or ask for a work or school excuse for 24 hours related to this e-Visit. If it has been greater than 24 hours you will need to follow up with your provider, or enter a new e-Visit to address those concerns.   You will get an e-mail in the next two days asking about your experience.  I hope that your e-visit has been valuable and will speed your recovery. Thank you for using e-visits.  

## 2017-05-30 ENCOUNTER — Telehealth: Payer: 59 | Admitting: Nurse Practitioner

## 2017-05-30 DIAGNOSIS — R05 Cough: Secondary | ICD-10-CM

## 2017-05-30 DIAGNOSIS — R059 Cough, unspecified: Secondary | ICD-10-CM

## 2017-05-30 MED ORDER — AZITHROMYCIN 250 MG PO TABS: ORAL_TABLET | ORAL | 0 refills | Status: DC

## 2017-05-30 NOTE — Progress Notes (Signed)

## 2017-06-26 ENCOUNTER — Telehealth: Payer: 59 | Admitting: Family

## 2017-06-26 DIAGNOSIS — M545 Low back pain, unspecified: Secondary | ICD-10-CM

## 2017-06-26 MED ORDER — ETODOLAC 300 MG PO CAPS: 300.0000 mg | ORAL_CAPSULE | Freq: Two times a day (BID) | ORAL | 0 refills | Status: DC

## 2017-06-26 MED ORDER — BACLOFEN 10 MG PO TABS: 10.0000 mg | ORAL_TABLET | Freq: Three times a day (TID) | ORAL | 0 refills | Status: DC | PRN

## 2017-06-26 MED FILL — BACLOFEN 10 MG TABLET: 10 | 10 days supply | Qty: 30 | Fill #0

## 2017-06-26 MED FILL — ETODOLAC 300 MG CAPSULE: 300 | 10 days supply | Qty: 20 | Fill #0

## 2017-06-26 NOTE — Progress Notes (Signed)

## 2017-07-09 ENCOUNTER — Other Ambulatory Visit: Payer: Self-pay | Admitting: Internal Medicine

## 2017-07-13 MED FILL — SERTRALINE HCL 100 MG TAB: 100 | 90 days supply | Qty: 180 | Fill #0

## 2017-09-11 ENCOUNTER — Ambulatory Visit (INDEPENDENT_AMBULATORY_CARE_PROVIDER_SITE_OTHER): Payer: No Typology Code available for payment source | Admitting: Internal Medicine

## 2017-09-11 ENCOUNTER — Encounter: Payer: Self-pay | Admitting: Internal Medicine

## 2017-09-11 VITALS — BP 112/78 | HR 64 | Temp 98.1°F | Ht 65.33 in | Wt 204.0 lb

## 2017-09-11 DIAGNOSIS — F419 Anxiety disorder, unspecified: Secondary | ICD-10-CM

## 2017-09-11 DIAGNOSIS — K219 Gastro-esophageal reflux disease without esophagitis: Secondary | ICD-10-CM

## 2017-09-11 DIAGNOSIS — Z0000 Encounter for general adult medical examination without abnormal findings: Secondary | ICD-10-CM

## 2017-09-11 LAB — COMPREHENSIVE METABOLIC PANEL
ALBUMIN: 4.3 g/dL (ref 3.5–5.2)
ALT: 8 U/L (ref 0–35)
AST: 12 U/L (ref 0–37)
Alkaline Phosphatase: 59 U/L (ref 39–117)
BUN: 12 mg/dL (ref 6–23)
CALCIUM: 8.8 mg/dL (ref 8.4–10.5)
CHLORIDE: 104 meq/L (ref 96–112)
CO2: 29 meq/L (ref 19–32)
CREATININE: 0.68 mg/dL (ref 0.40–1.20)
GFR: 106.15 mL/min (ref >60.00)
Glucose, Bld: 100 mg/dL — ABNORMAL HIGH (ref 70–99)
POTASSIUM: 4.3 meq/L (ref 3.5–5.1)
Sodium: 139 mEq/L (ref 135–145)
Total Bilirubin: 0.3 mg/dL (ref 0.2–1.2)
Total Protein: 7.3 g/dL (ref 6.0–8.3)

## 2017-09-11 LAB — CBC
HCT: 40.4 % (ref 36.0–46.0)
Hemoglobin: 13.6 g/dL (ref 12.0–15.0)
MCHC: 33.6 g/dL (ref 30.0–36.0)
MCV: 90.4 fl (ref 78.0–100.0)
PLATELETS: 145 10*3/uL — AB (ref 150.0–400.0)
RBC: 4.47 Mil/uL (ref 3.87–5.11)
RDW: 13.5 % (ref 11.5–15.5)
WBC: 8.1 10*3/uL (ref 4.0–10.5)

## 2017-09-11 LAB — LIPID PANEL
CHOL/HDL RATIO: 4
CHOLESTEROL: 213 mg/dL — AB (ref 0–200)
HDL: 52.5 mg/dL (ref >39.00)
NONHDL: 160.95
Triglycerides: 210 mg/dL — ABNORMAL HIGH (ref 0.0–149.0)
VLDL: 42 mg/dL — ABNORMAL HIGH (ref 0.0–40.0)

## 2017-09-11 LAB — LDL CHOLESTEROL, DIRECT: Direct LDL: 133 mg/dL

## 2017-09-11 MED ORDER — SERTRALINE HCL 100 MG PO TABS: 200.0000 mg | ORAL_TABLET | Freq: Every day | ORAL | 3 refills | Status: DC

## 2017-09-11 NOTE — Assessment & Plan Note (Signed)
Discussed avoiding foods that trigger your reflux Discussed how weight loss can help improve reflux Continue TUMS prn

## 2017-09-11 NOTE — Patient Instructions (Signed)

## 2017-09-11 NOTE — Progress Notes (Signed)
Subjective:    Patient ID: Robin Little, female    DOB: 09/24/1985, 32 y.o.   MRN: [id number]  HPI  Pt presents to the clinic today for her annual exam.  Anxiety: Triggered by work, school and family stress. She is taking Sertraline as prescribed. She denies SI/HI. She needs a refill of this medication.  GERD: Intermittently, occurring < 3 days per week. She takes Tums as needed with good relief of symptoms.  Flu: 12/2016 Tetanus: 06/2014 Pap Smear: 10/2016 Dentist: as needed  Diet: She does eat meat. She consumes fruits and veggies daly. She does eat fried foods. She drinks mostly Dr. Darden Amber, and flavored water. Exercise: None  Review of Systems      Past Medical History:  Diagnosis Date  . Anemia   . Anxiety   . GERD (gastroesophageal reflux disease)   . History of shingles     Current Outpatient Medications  Medication Sig Dispense Refill  . acetaminophen (TYLENOL) 500 MG tablet Take 1,000 mg by mouth every 6 (six) hours as needed for moderate pain.    . baclofen (LIORESAL) 10 MG tablet Take 1 tablet (10 mg total) by mouth every 8 (eight) hours as needed for muscle spasms. 30 each 0  . etodolac (LODINE) 300 MG capsule Take 1 capsule (300 mg total) by mouth 2 (two) times daily. 20 capsule 0  . sertraline (ZOLOFT) 100 MG tablet Take 2 tablets (200 mg total) by mouth daily. 180 tablet 3   No current facility-administered medications for this visit.     No Known Allergies  Family History  Problem Relation Age of Onset  . Hyperlipidemia Mother   . Hypertension Mother   . Arthritis Mother   . Hypothyroidism Mother   . Hyperlipidemia Father   . Hypertension Father   . Cancer Maternal Grandmother        melanoma  . Goiter Maternal Grandmother   . Cancer Paternal Grandmother        liver  . Diabetes Maternal Grandfather   . Parkinson's disease Maternal Grandfather   . Hyperlipidemia Maternal Grandfather   . Hypertension Maternal Grandfather   . Alcohol abuse  Paternal Grandfather     Social History   Socioeconomic History  . Marital status: Married    Spouse name: Not on file  . Number of children: Not on file  . Years of education: Not on file  . Highest education level: Not on file  Occupational History  . Not on file  Social Needs  . Financial resource strain: Not on file  . Food insecurity:    Worry: Not on file    Inability: Not on file  . Transportation needs:    Medical: Not on file    Non-medical: Not on file  Tobacco Use  . Smoking status: Never Smoker  . Smokeless tobacco: Never Used  Substance and Sexual Activity  . Alcohol use: Yes    Comment: social  . Drug use: No  . Sexual activity: Yes    Birth control/protection: IUD  Lifestyle  . Physical activity:    Days per week: Not on file    Minutes per session: Not on file  . Stress: Not on file  Relationships  . Social connections:    Talks on phone: Not on file    Gets together: Not on file    Attends religious service: Not on file    Active member of club or organization: Not on file    Attends  meetings of clubs or organizations: Not on file    Relationship status: Not on file  . Intimate partner violence:    Fear of current or ex partner: Not on file    Emotionally abused: Not on file    Physically abused: Not on file    Forced sexual activity: Not on file  Other Topics Concern  . Not on file  Social History Narrative  . Not on file     Constitutional: Denies fever, malaise, fatigue, headache or abrupt weight changes.  HEENT: Denies eye pain, eye redness, ear pain, ringing in the ears, wax buildup, runny nose, nasal congestion, bloody nose, or sore throat. Respiratory: Denies difficulty breathing, shortness of breath, cough or sputum production.   Cardiovascular: Denies chest pain, chest tightness, palpitations or swelling in the hands or feet.  Gastrointestinal: Pt reports intermittent reflux. Denies abdominal pain, bloating, constipation, diarrhea  or blood in the stool.  GU: Denies urgency, frequency, pain with urination, burning sensation, blood in urine, odor or discharge. Musculoskeletal: Denies decrease in range of motion, difficulty with gait, muscle pain or joint pain and swelling.  Skin: Denies redness, rashes, lesions or ulcercations.  Neurological: Denies dizziness, difficulty with memory, difficulty with speech or problems with balance and coordination.  Psych: Pt reports anxiety. Denies depression, SI/HI.  No other specific complaints in a complete review of systems (except as listed in HPI above).  Objective:   Physical Exam   BP 112/78   Pulse 64   Temp 98.1 F (36.7 C) (Oral)   Ht 5' 5.33" (1.659 m)   Wt 204 lb (92.5 kg)   LMP 09/11/2017   SpO2 98%   BMI 33.61 kg/m  Wt Readings from Last 3 Encounters:  09/11/17 204 lb (92.5 kg)  01/26/17 191 lb (86.6 kg)  09/09/16 185 lb 8 oz (84.1 kg)    General: Appears her stated age, obese in NAD. Skin: Warm, dry and intact.  HEENT: Head: normal shape and size; Eyes: sclera white, no icterus, conjunctiva pink, PERRLA and EOMs intact; Ears: Tm's gray and intact, normal light reflex; Throat/Mouth: Teeth present, mucosa pink and moist, no exudate, lesions or ulcerations noted.  Neck:  Neck supple, trachea midline. No masses, lumps or thyromegaly present.  Cardiovascular: Normal rate and rhythm. S1,S2 noted.  No murmur, rubs or gallops noted. No JVD or BLE edema.  Pulmonary/Chest: Normal effort and positive vesicular breath sounds. No respiratory distress. No wheezes, rales or ronchi noted.  Abdomen: Soft and nontender. Normal bowel sounds. No distention or masses noted. Liver, spleen and kidneys non palpable. Musculoskeletal: Strength 5/5 BUE/BLE. No difficulty with gait.  Neurological: Alert and oriented. Cranial nerves II-XII grossly intact. Coordination normal.  Psychiatric: Mood and affect normal. Behavior is normal. Judgment and thought content normal.    BMET      Component Value Date/Time   NA 139 01/26/2017 1058   K 4.3 01/26/2017 1058   CL 103 01/26/2017 1058   CO2 28 01/26/2017 1058   GLUCOSE 77 01/26/2017 1058   BUN 11 01/26/2017 1058   CREATININE 0.66 01/26/2017 1058   CALCIUM 8.6 01/26/2017 1058   GFRNONAA >60 03/22/2016 0419   GFRAA >60 03/22/2016 0419    Lipid Panel     Component Value Date/Time   CHOL 176 09/09/2016 0829   TRIG 101.0 09/09/2016 0829   HDL 50.50 09/09/2016 0829   CHOLHDL 3 09/09/2016 0829   VLDL 20.2 09/09/2016 0829   LDLCALC 106 (H) 09/09/2016 1308  CBC    Component Value Date/Time   WBC 5.3 09/09/2016 0829   RBC 4.49 09/09/2016 0829   HGB 13.9 09/09/2016 0829   HCT 41.7 09/09/2016 0829   PLT 123.0 (L) 09/09/2016 0829   MCV 92.8 09/09/2016 0829   MCH 30.1 03/22/2016 0419   MCHC 33.5 09/09/2016 0829   RDW 14.1 09/09/2016 0829    Hgb A1C No results found for: HGBA1C         Assessment & Plan:   Preventative Health Maintenance:  Encouraged her to get a flu shot in the fall Tetanus UTD Pap smear UTD Encouraged her to consume a balanced diet and exercise regimen Advised her to see a dentist annually Will check CBC, CMET, Lipid profile today  RTC in 1 year, sooner if needed Nicki Reaper, NP

## 2017-09-11 NOTE — Assessment & Plan Note (Signed)
Controlled on Sertraline, refilled today Support offered

## 2017-09-15 ENCOUNTER — Encounter: Payer: Self-pay | Admitting: Internal Medicine

## 2017-10-15 MED FILL — SERTRALINE HCL 100 MG TAB: 100 | 90 days supply | Qty: 180 | Fill #0

## 2017-11-18 ENCOUNTER — Other Ambulatory Visit: Payer: Self-pay | Admitting: Internal Medicine

## 2017-11-18 MED ORDER — CEPHALEXIN 500 MG PO CAPS: 500.0000 mg | ORAL_CAPSULE | Freq: Three times a day (TID) | ORAL | 0 refills | Status: DC

## 2018-01-29 MED FILL — SERTRALINE HCL 100 MG TAB: 100 | 90 days supply | Qty: 180 | Fill #1

## 2018-03-01 ENCOUNTER — Ambulatory Visit (INDEPENDENT_AMBULATORY_CARE_PROVIDER_SITE_OTHER): Payer: No Typology Code available for payment source

## 2018-03-01 ENCOUNTER — Encounter: Payer: Self-pay | Admitting: Internal Medicine

## 2018-03-01 ENCOUNTER — Telehealth: Payer: Self-pay | Admitting: Internal Medicine

## 2018-03-01 ENCOUNTER — Other Ambulatory Visit: Payer: Self-pay

## 2018-03-01 ENCOUNTER — Encounter (HOSPITAL_COMMUNITY): Payer: Self-pay | Admitting: Emergency Medicine

## 2018-03-01 ENCOUNTER — Ambulatory Visit (HOSPITAL_COMMUNITY)
Admission: EM | Admit: 2018-03-01 | Discharge: 2018-03-01 | Disposition: A | Discharge status: Home / Self Care | Payer: No Typology Code available for payment source

## 2018-03-01 DIAGNOSIS — M79645 Pain in left finger(s): Secondary | ICD-10-CM

## 2018-03-01 NOTE — Discharge Instructions (Signed)
Bones and joints intact.  I am concerned about potential strain/ligamental injury of the thumb.  Use of splint as provided.  Ice,elevation, ibuprofen for pain control.  I would recommend follow up with hand specialist for recheck and definitive treatment if symptoms persist.

## 2018-03-01 NOTE — ED Triage Notes (Signed)
Pt states she was sitting on a bed and used her hands to get up when she felt a pop in her left thumb.  Pt is still having pain.

## 2018-03-01 NOTE — ED Provider Notes (Signed)
[facility]    CSN: [id number] Arrival date & time: 03/01/18  1222     History   Chief Complaint Chief Complaint  Patient presents with  . thumb injury    left    HPI Robin Little is a 32 y.o. female.   Robin Little presents with complaints of left thumb pain. This morning she was sitting on her bed doing her daughters hair, but put her weight on her hands and thumb to push off and felt/heard a pop and had immediate pain to the thumb. Pain ever since. Pain with extension and abduction. Denies any previous thumb or hand injury. No numbness or tingling. Pain 6/10. Took ibuprofen which helped some. She is right handed. Without contributing medical history.      ROS per HPI.      Past Medical History:  Diagnosis Date  . Anemia   . Anxiety   . GERD (gastroesophageal reflux disease)   . History of shingles     Patient Active Problem List   Diagnosis Date Noted  . Anxiety 07/14/2016  . Gastroesophageal reflux disease 07/14/2016    Past Surgical History:  Procedure Laterality Date  . NO PAST SURGERIES      OB History    Gravida  2   Para  2   Term  2   Preterm      AB      Living  2     SAB      TAB      Ectopic      Multiple  0   Live Births  2            Home Medications    Prior to Admission medications   Medication Sig Start Date End Date Taking? Authorizing Provider  ibuprofen (ADVIL,MOTRIN) 800 MG tablet Take 800 mg by mouth every 8 (eight) hours as needed.   Yes [provider]  sertraline (ZOLOFT) 100 MG tablet Take 2 tablets (200 mg total) by mouth daily. 09/11/17  Yes Baity, Salvadore Oxford, NP  acetaminophen (TYLENOL) 500 MG tablet Take 1,000 mg by mouth every 6 (six) hours as needed for moderate pain.    [provider]    Family History Family History  Problem Relation Age of Onset  . Hyperlipidemia Mother   . Hypertension Mother   . Arthritis Mother   . Hypothyroidism Mother   . Hyperlipidemia  Father   . Hypertension Father   . Cancer Maternal Grandmother        melanoma  . Goiter Maternal Grandmother   . Cancer Paternal Grandmother        liver  . Diabetes Maternal Grandfather   . Parkinson's disease Maternal Grandfather   . Hyperlipidemia Maternal Grandfather   . Hypertension Maternal Grandfather   . Alcohol abuse Paternal Grandfather     Social History Social History   Tobacco Use  . Smoking status: Never Smoker  . Smokeless tobacco: Never Used  Substance Use Topics  . Alcohol use: Yes    Comment: social  . Drug use: No     Allergies   Patient has no known allergies.   Review of Systems Review of Systems   Physical Exam Triage Vital Signs ED Triage Vitals  Enc Vitals Group     BP 03/01/18 1334 130/77     Pulse Rate 03/01/18 1334 73     Resp 03/01/18 1334 18     Temp 03/01/18 1334 98.1 F (36.7 C)  Temp Source 03/01/18 1334 Oral     SpO2 03/01/18 1334 100 %     Weight --      Height --      Head Circumference --      Peak Flow --      Pain Score 03/01/18 1342 6     Pain Loc --      Pain Edu? --      Excl. in GC? --    No data found.  Updated Vital Signs BP 130/77 (BP Location: Right Arm)   Pulse 73   Temp 98.1 F (36.7 C) (Oral)   Resp 18   LMP 02/24/2018 (Exact Date)   SpO2 100%   Visual Acuity Right Eye Distance:   Left Eye Distance:   Bilateral Distance:    Right Eye Near:   Left Eye Near:    Bilateral Near:     Physical Exam  Constitutional: She is oriented to person, place, and time. She appears well-developed and well-nourished. No distress.  Cardiovascular: Normal rate, regular rhythm and normal heart sounds.  Pulmonary/Chest: Effort normal and breath sounds normal.  Musculoskeletal:       Left hand: She exhibits decreased range of motion, tenderness and bony tenderness. She exhibits normal two-point discrimination, normal capillary refill, no deformity, no laceration and no swelling. Normal sensation noted.  Decreased strength noted. She exhibits finger abduction and thumb/finger opposition.  Tenderness to proximal phalanx and MCP joint of left thumb; strength intact, mild laxity noted with valgus stress application; pain and limited rom with opposition and with abduction; no deformity, no swelling or bruising; cap refill < 2 seconds; strong radial pulse   Neurological: She is alert and oriented to person, place, and time.  Skin: Skin is warm and dry.     UC Treatments / Results  Labs (all labs ordered are listed, but only abnormal results are displayed) Labs Reviewed - No data to display  EKG None  Radiology Dg Finger Thumb Left  Result Date: 03/01/2018 CLINICAL DATA:  32 year old who slipped off the bed earlier today and injured the LEFT thumb. Initial encounter. EXAM: LEFT THUMB 2+V COMPARISON:  None. FINDINGS: No evidence of acute fracture or dislocation. Joint spaces well preserved. Well-preserved bone mineral density. No intrinsic osseous abnormalities. IMPRESSION: Normal examination. Electronically Signed   By: Hulan Saas M.D.   On: 03/01/2018 14:09    Procedures Procedures (including critical care time)  Medications Ordered in UC Medications - No data to display  Initial Impression / Assessment and Plan / UC Course  I have reviewed the triage vital signs and the nursing notes.  Pertinent labs & imaging results that were available during my care of the patient were reviewed by me and considered in my medical decision making (see chart for details).     Bone and joints intact on xray. Strain consistent. Thumb spica provided. Follow up with hand surgeon for further eval and treat- question skiers thumb? Ice, elevation, ibuprofen for pain. Patient verbalized understanding and agreeable to plan.   Final Clinical Impressions(s) / UC Diagnoses   Final diagnoses:  Thumb pain, left     Discharge Instructions     Bones and joints intact.  I am concerned about potential  strain/ligamental injury of the thumb.  Use of splint as provided.  Ice,elevation, ibuprofen for pain control.  I would recommend follow up with hand specialist for recheck and definitive treatment if symptoms persist.    ED Prescriptions    None  Controlled Substance Prescriptions [place] Controlled Substance Registry consulted? Not Applicable   Georgetta Haber, NP 03/01/18 1426

## 2018-03-01 NOTE — Telephone Encounter (Signed)
Pt called wanting to be seen for thumb pain/possibly pulled out of socket. There were no openings available with any providers. Did offer [employer] and urgent care. Pt confirmed that she would be going to urgent care today.

## 2018-03-01 NOTE — Telephone Encounter (Signed)
Noted, agree with UC if no appts here

## 2018-04-19 ENCOUNTER — Ambulatory Visit (INDEPENDENT_AMBULATORY_CARE_PROVIDER_SITE_OTHER): Payer: No Typology Code available for payment source | Admitting: Internal Medicine

## 2018-04-19 ENCOUNTER — Encounter: Payer: Self-pay | Admitting: Internal Medicine

## 2018-04-19 VITALS — BP 116/80 | HR 76 | Temp 98.8°F | Wt 212.0 lb

## 2018-04-19 DIAGNOSIS — R41840 Attention and concentration deficit: Secondary | ICD-10-CM

## 2018-04-19 DIAGNOSIS — F411 Generalized anxiety disorder: Secondary | ICD-10-CM

## 2018-04-19 MED ORDER — BUPROPION HCL ER (XL) 150 MG PO TB24: 150.0000 mg | ORAL_TABLET | Freq: Every day | ORAL | 2 refills | Status: DC

## 2018-04-19 MED FILL — buPROPion HCL ER (XL) 150 M: 150 | 30 days supply | Qty: 30 | Fill #0

## 2018-04-19 NOTE — Patient Instructions (Signed)
Attention Deficit Hyperactivity Disorder, Adult Attention deficit hyperactivity disorder (ADHD) is a mental health disorder that starts during childhood. For many people with ADHD, the disorder continues into adult years. There are many things that you and your health care provider or therapist (mental health professional) can do to manage your symptoms. What are the causes? The exact cause of ADHD is not known. What increases the risk? You are more likely to develop this condition if:  You have a family history of ADHD.  You are female.  You were born to a mother who smoked or drank alcohol during pregnancy.  You were exposed to lead poisoning or other toxins in the womb or in early life.  You were born before 37 weeks of pregnancy (prematurely) or you had a low birth weight.  You have experienced a brain injury. What are the signs or symptoms? Symptoms of this condition depend on the type of ADHD. The two main types are inattentive and hyperactive-impulsive. Some people may have symptoms of both types. Symptoms of the inattentive type include:  Difficulty watching, listening, or thinking with focused effort (paying attention).  Making careless mistakes.  Not listening.  Not following instructions.  Being disorganized.  Avoiding tasks that require time and attention.  Losing things.  Forgetting things.  Being easily distracted. Symptoms of the hyperactive-impulsive type include:  Restlessness.  Talking too much.  Interrupting.  Difficulty with: ? Sitting still. ? Staying quiet. ? Feeling motivated. ? Relaxing. ? Waiting in line or waiting for a turn. How is this diagnosed? This condition is diagnosed based on your current symptoms and your history of symptoms. The diagnosis can be made by a provider such as a primary care provider, psychiatrist, psychologist, or clinical social worker. The provider may use a symptom checklist or a standardized behavior rating  scale to evaluate your symptoms. He or she may want to talk with family members who have known you for a long time and have observed your behaviors. There are no lab tests or brain imaging tests that can diagnose ADHD. How is this treated? This condition can be treated with medicines and behavior therapy. Medicines may be the best option to reduce impulsive behaviors and improve attention. Your health care provider may recommend:  Stimulant medicines. These are the most common medicines used for adult ADHD. They affect certain chemicals in the brain (neurotransmitters). These medicines may be long-acting or short-acting. This will determine how often you need to take the medicine.  A non-stimulant medicine for adult ADHD (atomoxetine). This medicine increases a neurotransmitter called norepinephrine. It may take weeks to months to see effects from this medicine. Psychotherapy and behavioral management are also important for treating ADHD. Psychotherapy is often used along with medicine. Your health care provider may suggest:  Cognitive behavioral therapy (CBT). This type of therapy teaches you to replace negative thoughts and actions with positive thoughts and actions. When used as part of ADHD treatment, this therapy may also include: ? Coping strategies for organization, time management, impulse control, and stress reduction. ? Mindfulness and meditation training.  Behavioral management. This may include strategies for organization and time management. You may work with an ADHD coach who is specially trained to help people with ADHD to manage and organize activities and to function more effectively. Follow these instructions at home: Medicines   Take over-the-counter and prescription medicines only as told by your health care provider.  Talk with your health care provider about the possible side effects of your   medicine to watch for. General instructions   Learn as much as you can about  adult ADHD, and work closely with your health care providers to find the treatments that work best for you.  Do not use drugs or abuse alcohol. Limit alcohol intake to no more than 1 drink a day for nonpregnant women and 2 drinks a day for men. One drink equals 12 oz of beer, 5 oz of wine, or 1 oz of hard liquor.  Follow the same schedule each day. Make sure your schedule includes enough time for you to get plenty of sleep.  Use reminder devices like notes, calendars, and phone apps to stay on-time and organized.  Eat a healthy diet. Do not skip meals.  Exercise regularly. Exercise can help to reduce stress and anxiety.  Keep all follow-up visits as told by your health care provider and therapist. This is important. Where to find more information  A health care provider may be able to recommend resources that are available online or over the phone. You could start with: ? Attention Deficit Disorder Association (ADDA): www.add.org ? National Institute of Mental Health (NIMH): www.nimh.nih.gov Contact a health care provider if:  Your symptoms are changing, getting worse, or not improving.  You have side effects from your medicine, such as: ? Repeated muscle twitches, coughing, or speech outbursts. ? Sleep problems. ? Loss of appetite. ? Depression. ? New or worsening behavior problems. ? Dizziness. ? Unusually fast heartbeat. ? Stomach pains. ? Headaches.  You are struggling with anxiety, depression, or substance abuse. Get help right away if:  You have a severe reaction to a medicine.  You have thoughts of hurting yourself or others. If you ever feel like you may hurt yourself or others, or have thoughts about taking your own life, get help right away. You can go to the nearest emergency department or call:  Your local emergency services (911 in the [place]  A suicide crisis helpline, such as the National Suicide Prevention Lifeline at 1-800-273-8255. This is open 24 hours a  day. Summary  ADHD is a mental health disorder that starts during childhood and often continues into adult years.  The exact cause of ADHD is not known.  There is no cure for ADHD, but treatment with medicine, therapy, or behavioral training can help you manage your condition. This information is not intended to replace advice given to you by your health care provider. Make sure you discuss any questions you have with your health care provider. Document Released: 12/04/2016 Document Revised: 12/04/2016 Document Reviewed: 12/04/2016 Elsevier Interactive Patient Education  2019 Elsevier Inc.  

## 2018-04-19 NOTE — Progress Notes (Signed)
Subjective:    Patient ID: Robin Little, female    DOB: April 07, 1986, 32 y.o.   MRN: [id number]  HPI  Pt presents to the clinic today with c/o trouble focusing in school. She noticed this a few months ago. 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 When she is not at work or school, she is having trouble with motivation, focusing in order get things done. Her grades are not reflecting the trouble she is having but she reports she has always been able to compensate. She is feeling very overwhelmed, and this is increasing her stress and anxiety. She is currently taking Sertraline 200 mg daily. She has never been diagnosed with ADHD or a learning disability in the past. She reports her mom and her sister both have ADHD.    Review of Systems      Past Medical History:  Diagnosis Date  . Anemia   . Anxiety   . GERD (gastroesophageal reflux disease)   . History of shingles     Current Outpatient Medications  Medication Sig Dispense Refill  . acetaminophen (TYLENOL) 500 MG tablet Take 1,000 mg by mouth every 6 (six) hours as needed for moderate pain.    Marland Kitchen. ibuprofen (ADVIL,MOTRIN) 800 MG tablet Take 800 mg by mouth every 8 (eight) hours as needed.    . sertraline (ZOLOFT) 100 MG tablet Take 2 tablets (200 mg total) by mouth daily. 180 tablet 3   No current facility-administered medications for this visit.     No Known Allergies  Family History  Problem Relation Age of Onset  . Hyperlipidemia Mother   . Hypertension Mother   . Arthritis Mother   . Hypothyroidism Mother   . Hyperlipidemia Father   . Hypertension Father   . Cancer Maternal Grandmother        melanoma  . Goiter Maternal Grandmother   . Cancer Paternal Grandmother        liver  . Diabetes Maternal Grandfather   . Parkinson's disease Maternal Grandfather   . Hyperlipidemia Maternal Grandfather   . Hypertension Maternal Grandfather   . Alcohol abuse Paternal Grandfather      Social History   Socioeconomic History  . Marital status: Married    Spouse name: Not on file  . Number of children: Not on file  . Years of education: Not on file  . Highest education level: Not on file  Occupational History  . Not on file  Social Needs  . Financial resource strain: Not on file  . Food insecurity:    Worry: Not on file    Inability: Not on file  . Transportation needs:    Medical: Not on file    Non-medical: Not on file  Tobacco Use  . Smoking status: Never Smoker  . Smokeless tobacco: Never Used  Substance and Sexual Activity  . Alcohol use: Yes    Comment: social  . Drug use: No  . Sexual activity: Yes    Birth control/protection: I.U.D.  Lifestyle  . Physical activity:    Days per week: Not on file    Minutes per session: Not on file  . Stress: Not on file  Relationships  . Social connections:    Talks on phone: Not on file    Gets together: Not on file    Attends religious service: Not on file    Active member of club or organization: Not on file    Attends meetings of  clubs or organizations: Not on file    Relationship status: Not on file  . Intimate partner violence:    Fear of current or ex partner: Not on file    Emotionally abused: Not on file    Physically abused: Not on file    Forced sexual activity: Not on file  Other Topics Concern  . Not on file  Social History Narrative  . Not on file     Constitutional: Denies fever, malaise, fatigue, headache or abrupt weight changes.  Neurological: Pt reports trouble focusing. Denies dizziness, difficulty with memory, difficulty with speech or problems with balance and coordination.  Psych: Pt reports stress and anxiety. Denies depression, SI/HI.  No other specific complaints in a complete review of systems (except as listed in HPI above).  Objective:   Physical Exam  BP 116/80   Pulse 76   Temp 98.8 F (37.1 C) (Oral)   Wt 212 lb (96.2 kg)   LMP 03/24/2018   SpO2 98%   BMI  34.92 kg/m  Wt Readings from Last 3 Encounters:  04/19/18 212 lb (96.2 kg)  09/11/17 204 lb (92.5 kg)  01/26/17 191 lb (86.6 kg)    General: Appears her stated age, obese, in NAD. Neurological: Alert and oriented.  Psychiatric: Mood and affect normal. Behavior is normal. Judgment and thought content normal.     BMET    Component Value Date/Time   NA 139 09/11/2017 1446   K 4.3 09/11/2017 1446   CL 104 09/11/2017 1446   CO2 29 09/11/2017 1446   GLUCOSE 100 (H) 09/11/2017 1446   BUN 12 09/11/2017 1446   CREATININE 0.68 09/11/2017 1446   CALCIUM 8.8 09/11/2017 1446   GFRNONAA >60 03/22/2016 0419   GFRAA >60 03/22/2016 0419    Lipid Panel     Component Value Date/Time   CHOL 213 (H) 09/11/2017 1446   TRIG 210.0 (H) 09/11/2017 1446   HDL 52.50 09/11/2017 1446   CHOLHDL 4 09/11/2017 1446   VLDL 42.0 (H) 09/11/2017 1446   LDLCALC 106 (H) 09/09/2016 0829    CBC    Component Value Date/Time   WBC 8.1 09/11/2017 1446   RBC 4.47 09/11/2017 1446   HGB 13.6 09/11/2017 1446   HCT 40.4 09/11/2017 1446   PLT 145.0 (L) 09/11/2017 1446   MCV 90.4 09/11/2017 1446   MCH 30.1 03/22/2016 0419   MCHC 33.6 09/11/2017 1446   RDW 13.5 09/11/2017 1446    Hgb A1C No results found for: HGBA1C         Assessment & Plan:   Inattention,GAD:  Discussed treatment with Wellbutrin vs ADHD medication Discussed referral to Dr. Reggy EyeAltabet for formal testing She would like to try Wellbutrin at this time RX for Wellbutrin 150 mg daily x 2 weeks, can increase to 300 mg at that time if needed Continue Sertraline at current dose Support offered today  She will update me in 2 weeks via mychart and let me know how she is doing Nicki Reaperegina Baity, NP

## 2018-05-10 ENCOUNTER — Encounter: Payer: Self-pay | Admitting: Internal Medicine

## 2018-05-10 MED ORDER — BUPROPION HCL ER (XL) 300 MG PO TB24: 300.0000 mg | ORAL_TABLET | Freq: Every day | ORAL | 1 refills | Status: DC

## 2018-05-10 MED FILL — buPROPion HCL ER (XL) 300 M: 300 | 90 days supply | Qty: 90 | Fill #0

## 2018-05-10 MED FILL — SERTRALINE HCL 100 MG TAB: 100 | 90 days supply | Qty: 180 | Fill #2

## 2018-07-06 ENCOUNTER — Ambulatory Visit (INDEPENDENT_AMBULATORY_CARE_PROVIDER_SITE_OTHER): Payer: No Typology Code available for payment source | Admitting: Internal Medicine

## 2018-07-06 ENCOUNTER — Ambulatory Visit: Payer: No Typology Code available for payment source | Admitting: Internal Medicine

## 2018-07-06 ENCOUNTER — Encounter: Payer: Self-pay | Admitting: Internal Medicine

## 2018-07-06 VITALS — BP 116/74 | HR 86 | Temp 98.1°F | Wt 207.0 lb

## 2018-07-06 DIAGNOSIS — M436 Torticollis: Secondary | ICD-10-CM

## 2018-07-06 MED ORDER — METHOCARBAMOL 500 MG PO TABS: 500.0000 mg | ORAL_TABLET | Freq: Three times a day (TID) | ORAL | 0 refills | Status: DC | PRN

## 2018-07-06 NOTE — Progress Notes (Signed)
Subjective:    Patient ID: Robin Little, female    DOB: 1985/08/02, 33 y.o.   MRN: [id number]  HPI  Patient presents to the clinic today with complaint of neck pain and headache.  She reports this started 2 weeks ago.  The neck pain is located on the left side of her neck.  She describes the pain as sharp and shooting.  The pain does not radiate.  The pain is worse with movement.  She denies numbness or tingling down the left arm.  She denies any injury to the neck that she is aware of.  She reports associated headache on the left side of her head. She denies dizziness or visual changes. She has tried Ibuprofen, heating pad and dry needling with some relief. She did an Evisit, prescribed Diclofenac which she has been taking with some relief.  Review of Systems      Past Medical History:  Diagnosis Date  . Anemia   . Anxiety   . GERD (gastroesophageal reflux disease)   . History of shingles     Current Outpatient Medications  Medication Sig Dispense Refill  . acetaminophen (TYLENOL) 500 MG tablet Take 1,000 mg by mouth every 6 (six) hours as needed for moderate pain.    Marland Kitchen buPROPion (WELLBUTRIN XL) 300 MG 24 hr tablet Take 1 tablet (300 mg total) by mouth daily. 90 tablet 1  . diclofenac (VOLTAREN) 25 MG EC tablet     . ibuprofen (ADVIL,MOTRIN) 800 MG tablet Take 800 mg by mouth every 8 (eight) hours as needed.    . sertraline (ZOLOFT) 100 MG tablet Take 2 tablets (200 mg total) by mouth daily. 180 tablet 3  . methocarbamol (ROBAXIN) 500 MG tablet Take 1 tablet (500 mg total) by mouth every 8 (eight) hours as needed for muscle spasms. 20 tablet 0   No current facility-administered medications for this visit.     No Known Allergies  Family History  Problem Relation Age of Onset  . Hyperlipidemia Mother   . Hypertension Mother   . Arthritis Mother   . Hypothyroidism Mother   . Hyperlipidemia Father   . Hypertension Father   . Cancer Maternal Grandmother        melanoma    . Goiter Maternal Grandmother   . Cancer Paternal Grandmother        liver  . Diabetes Maternal Grandfather   . Parkinson's disease Maternal Grandfather   . Hyperlipidemia Maternal Grandfather   . Hypertension Maternal Grandfather   . Alcohol abuse Paternal Grandfather     Social History   Socioeconomic History  . Marital status: Married    Spouse name: Not on file  . Number of children: Not on file  . Years of education: Not on file  . Highest education level: Not on file  Occupational History  . Not on file  Social Needs  . Financial resource strain: Not on file  . Food insecurity:    Worry: Not on file    Inability: Not on file  . Transportation needs:    Medical: Not on file    Non-medical: Not on file  Tobacco Use  . Smoking status: Never Smoker  . Smokeless tobacco: Never Used  Substance and Sexual Activity  . Alcohol use: Yes    Comment: social  . Drug use: No  . Sexual activity: Yes    Birth control/protection: I.U.D.  Lifestyle  . Physical activity:    Days per week: Not on file  Minutes per session: Not on file  . Stress: Not on file  Relationships  . Social connections:    Talks on phone: Not on file    Gets together: Not on file    Attends religious service: Not on file    Active member of club or organization: Not on file    Attends meetings of clubs or organizations: Not on file    Relationship status: Not on file  . Intimate partner violence:    Fear of current or ex partner: Not on file    Emotionally abused: Not on file    Physically abused: Not on file    Forced sexual activity: Not on file  Other Topics Concern  . Not on file  Social History Narrative  . Not on file     Constitutional: Pt reports headache. Denies fever, malaise, fatigue, or abrupt weight changes. Marland Kitchen Respiratory: Denies difficulty breathing, shortness of breath, cough or sputum production.   Cardiovascular: Denies chest pain, chest tightness, palpitations or  swelling in the hands or feet.  Musculoskeletal: Pt reports neck pain. Denies decrease in range of motion, difficulty with gait, or joint  swelling.  Neurological: Denies dizziness, difficulty with memory, difficulty with speech or problems with balance and coordination.    No other specific complaints in a complete review of systems (except as listed in HPI above).  Objective:   Physical Exam    BP 116/74   Pulse 86   Temp 98.1 F (36.7 C) (Oral)   Wt 207 lb (93.9 kg)   LMP 06/06/2018   SpO2 98%   BMI 34.10 kg/m  Wt Readings from Last 3 Encounters:  07/06/18 207 lb (93.9 kg)  04/19/18 212 lb (96.2 kg)  09/11/17 204 lb (92.5 kg)    General: Appears her stated age, obese, in NAD. Cardiovascular: Normal rate and rhythm. S1,S2 noted.  No murmur, rubs or gallops noted.  Pulmonary/Chest: Normal effort and positive vesicular breath sounds. No respiratory distress. No wheezes, rales or ronchi noted.  Musculoskeletal: Normal flexion of the cervical spine. Decreased extension. Pain with rotation. No bony tenderness noted over the spine. Pain with palpation of the left paracervical muscles. Shoulder strength equal bilaterally. Strength 5/5 BUE. Neurological: Alert and oriented. Coordination normal.    BMET    Component Value Date/Time   NA 139 09/11/2017 1446   K 4.3 09/11/2017 1446   CL 104 09/11/2017 1446   CO2 29 09/11/2017 1446   GLUCOSE 100 (H) 09/11/2017 1446   BUN 12 09/11/2017 1446   CREATININE 0.68 09/11/2017 1446   CALCIUM 8.8 09/11/2017 1446   GFRNONAA >60 03/22/2016 0419   GFRAA >60 03/22/2016 0419    Lipid Panel     Component Value Date/Time   CHOL 213 (H) 09/11/2017 1446   TRIG 210.0 (H) 09/11/2017 1446   HDL 52.50 09/11/2017 1446   CHOLHDL 4 09/11/2017 1446   VLDL 42.0 (H) 09/11/2017 1446   LDLCALC 106 (H) 09/09/2016 0829    CBC    Component Value Date/Time   WBC 8.1 09/11/2017 1446   RBC 4.47 09/11/2017 1446   HGB 13.6 09/11/2017 1446   HCT 40.4  09/11/2017 1446   PLT 145.0 (L) 09/11/2017 1446   MCV 90.4 09/11/2017 1446   MCH 30.1 03/22/2016 0419   MCHC 33.6 09/11/2017 1446   RDW 13.5 09/11/2017 1446    Hgb A1C No results found for: HGBA1C        Assessment & Plan:   Torticollis:  Encouraged heat, stretching and massage Continue Diclofenac, avoid NSAID's RX for Methocarbamol 500 mg TID prn- sedation caution given  Return precautions discussed Nicki Reaper, NP

## 2018-07-06 NOTE — Patient Instructions (Signed)
Neck Exercises  Neck exercises can be important for many reasons:   They can help you to improve and maintain flexibility in your neck. This can be especially important as you age.   They can help to make your neck stronger. This can make movement easier.   They can reduce or prevent neck pain.   They may help your upper back.  Ask your health care provider which neck exercises would be best for you.  Exercises to improve neck flexibility  Neck stretch  Repeat this exercise 3-5 times.  1. Do this exercise while standing or while sitting in a chair.  2. Place your feet flat on the floor, shoulder-width apart.  3. Slowly turn your head to the right. Turn it all the way to the right so you can look over your right shoulder. Do not tilt or tip your head.  4. Hold this position for 10-30 seconds.  5. Slowly turn your head to the left, to look over your left shoulder.  6. Hold this position for 10-30 seconds.    Neck retraction  Repeat this exercise 8-10 times. Do this 3-4 times a day or as told by your health care provider.  1. Do this exercise while standing or while sitting in a sturdy chair.  2. Look straight ahead. Do not bend your neck.  3. Use your fingers to push your chin backward. Do not bend your neck for this movement. Continue to face straight ahead. If you are doing the exercise properly, you will feel a slight sensation in your throat and a stretch at the back of your neck.  4. Hold the stretch for 1-2 seconds. Relax and repeat.  Exercises to improve neck strength  Neck press  Repeat this exercise 10 times. Do it first thing in the morning and right before bed or as told by your health care provider.  1. Lie on your back on a firm bed or on the floor with a pillow under your head.  2. Use your neck muscles to push your head down on the pillow and straighten your spine.  3. Hold the position as well as you can. Keep your head facing up and your chin tucked.  4. Slowly count to 5 while holding this  position.  5. Relax for a few seconds. Then repeat.  Isometric strengthening  Do a full set of these exercises 2 times a day or as told by your health care provider.  1. Sit in a supportive chair and place your hand on your forehead.  2. Push forward with your head and neck while pushing back with your hand. Hold for 10 seconds.  3. Relax. Then repeat the exercise 3 times.  4. Next, do thesequence again, this time putting your hand against the back of your head. Use your head and neck to push backward against the hand pressure.  5. Finally, do the same exercise on either side of your head, pushing sideways against the pressure of your hand.  Prone head lifts  Repeat this exercise 5 times. Do this 2 times a day or as told by your health care provider.  1. Lie face-down, resting on your elbows so that your chest and upper back are raised.  2. Start with your head facing downward, near your chest. Position your chin either on or near your chest.  3. Slowly lift your head upward. Lift until you are looking straight ahead. Then continue lifting your head as far back as you   can stretch.  4. Hold your head up for 5 seconds. Then slowly lower it to your starting position.  Supine head lifts  Repeat this exercise 8-10 times. Do this 2 times a day or as told by your health care provider.  1. Lie on your back, bending your knees to point to the ceiling and keeping your feet flat on the floor.  2. Lift your head slowly off the floor, raising your chin toward your chest.  3. Hold for 5 seconds.  4. Relax and repeat.  Scapular retraction  Repeat this exercise 5 times. Do this 2 times a day or as told by your health care provider.  1. Stand with your arms at your sides. Look straight ahead.  2. Slowly pull both shoulders backward and downward until you feel a stretch between your shoulder blades in your upper back.  3. Hold for 10-30 seconds.  4. Relax and repeat.  Contact a health care provider if:   Your neck pain or  discomfort gets much worse when you do an exercise.   Your neck pain or discomfort does not improve within 2 hours after you exercise.  If you have any of these problems, stop exercising right away. Do not do the exercises again unless your health care provider says that you can.  Get help right away if:   You develop sudden, severe neck pain. If this happens, stop exercising right away. Do not do the exercises again unless your health care provider says that you can.  This information is not intended to replace advice given to you by your health care provider. Make sure you discuss any questions you have with your health care provider.  Document Released: 03/26/2015 Document Revised: 08/18/2017 Document Reviewed: 10/23/2014  Elsevier Interactive Patient Education  2019 [employer]

## 2018-07-26 MED FILL — buPROPion HCL ER (XL) 300 M: 300 | 90 days supply | Qty: 90 | Fill #0

## 2018-07-26 MED FILL — SERTRALINE HCL 100 MG TAB: 100 | 90 days supply | Qty: 180 | Fill #0

## 2018-08-21 ENCOUNTER — Telehealth: Payer: No Typology Code available for payment source | Admitting: Nurse Practitioner

## 2018-08-21 DIAGNOSIS — B9689 Other specified bacterial agents as the cause of diseases classified elsewhere: Secondary | ICD-10-CM

## 2018-08-21 DIAGNOSIS — N760 Acute vaginitis: Secondary | ICD-10-CM | POA: Diagnosis not present

## 2018-08-21 MED ORDER — METRONIDAZOLE 500 MG PO TABS: 500.0000 mg | ORAL_TABLET | Freq: Two times a day (BID) | ORAL | 0 refills | Status: DC

## 2018-08-21 NOTE — Progress Notes (Signed)

## 2018-08-26 ENCOUNTER — Other Ambulatory Visit: Payer: Self-pay | Admitting: Internal Medicine

## 2018-08-26 ENCOUNTER — Encounter: Payer: Self-pay | Admitting: Internal Medicine

## 2018-08-26 DIAGNOSIS — Z111 Encounter for screening for respiratory tuberculosis: Secondary | ICD-10-CM

## 2018-08-30 ENCOUNTER — Other Ambulatory Visit (INDEPENDENT_AMBULATORY_CARE_PROVIDER_SITE_OTHER): Payer: No Typology Code available for payment source

## 2018-08-30 ENCOUNTER — Other Ambulatory Visit: Payer: Self-pay

## 2018-08-30 DIAGNOSIS — Z111 Encounter for screening for respiratory tuberculosis: Secondary | ICD-10-CM

## 2018-08-30 NOTE — Addendum Note (Signed)
Addended by: Alvina Chou on: 08/30/2018 12:48 PM   Modules accepted: Orders

## 2018-09-01 LAB — QUANTIFERON-TB GOLD PLUS
Mitogen-NIL: >10 IU/mL
NIL: 0.02 IU/mL
QuantiFERON-TB Gold Plus: NEGATIVE
TB1-NIL: 0 IU/mL
TB2-NIL: 0.02 IU/mL

## 2018-10-05 ENCOUNTER — Other Ambulatory Visit: Payer: Self-pay | Admitting: Internal Medicine

## 2018-10-05 DIAGNOSIS — Z0000 Encounter for general adult medical examination without abnormal findings: Secondary | ICD-10-CM

## 2018-10-05 MED ORDER — BUTALBITAL-APAP-CAFFEINE 50-325-40 MG PO TABS: 1.0000 | ORAL_TABLET | Freq: Four times a day (QID) | ORAL | 0 refills | Status: DC | PRN

## 2018-10-06 ENCOUNTER — Other Ambulatory Visit: Payer: Self-pay

## 2018-10-06 ENCOUNTER — Encounter: Payer: Self-pay | Admitting: Internal Medicine

## 2018-10-06 ENCOUNTER — Ambulatory Visit (INDEPENDENT_AMBULATORY_CARE_PROVIDER_SITE_OTHER): Payer: No Typology Code available for payment source | Admitting: Internal Medicine

## 2018-10-06 ENCOUNTER — Other Ambulatory Visit (INDEPENDENT_AMBULATORY_CARE_PROVIDER_SITE_OTHER): Payer: No Typology Code available for payment source

## 2018-10-06 VITALS — BP 118/76 | HR 72 | Temp 97.9°F | Ht 65.0 in | Wt 213.0 lb

## 2018-10-06 DIAGNOSIS — Z0000 Encounter for general adult medical examination without abnormal findings: Secondary | ICD-10-CM

## 2018-10-06 DIAGNOSIS — R41840 Attention and concentration deficit: Secondary | ICD-10-CM | POA: Insufficient documentation

## 2018-10-06 DIAGNOSIS — K219 Gastro-esophageal reflux disease without esophagitis: Secondary | ICD-10-CM | POA: Diagnosis not present

## 2018-10-06 DIAGNOSIS — R519 Headache, unspecified: Secondary | ICD-10-CM | POA: Insufficient documentation

## 2018-10-06 DIAGNOSIS — F419 Anxiety disorder, unspecified: Secondary | ICD-10-CM | POA: Diagnosis not present

## 2018-10-06 DIAGNOSIS — R51 Headache: Secondary | ICD-10-CM

## 2018-10-06 LAB — COMPREHENSIVE METABOLIC PANEL
ALT: 9 U/L (ref 0–35)
AST: 13 U/L (ref 0–37)
Albumin: 4.1 g/dL (ref 3.5–5.2)
Alkaline Phosphatase: 67 U/L (ref 39–117)
BUN: 10 mg/dL (ref 6–23)
CO2: 27 mEq/L (ref 19–32)
Calcium: 8.5 mg/dL (ref 8.4–10.5)
Chloride: 105 mEq/L (ref 96–112)
Creatinine, Ser: 0.82 mg/dL (ref 0.40–1.20)
GFR: 79.94 mL/min (ref >60.00)
Glucose, Bld: 81 mg/dL (ref 70–99)
Potassium: 4.3 mEq/L (ref 3.5–5.1)
Sodium: 138 mEq/L (ref 135–145)
Total Bilirubin: 0.3 mg/dL (ref 0.2–1.2)
Total Protein: 6.7 g/dL (ref 6.0–8.3)

## 2018-10-06 LAB — LIPID PANEL
Cholesterol: 208 mg/dL — ABNORMAL HIGH (ref 0–200)
HDL: 48.9 mg/dL (ref >39.00)
LDL Cholesterol: 130 mg/dL — ABNORMAL HIGH (ref 0–99)
NonHDL: 159.33
Total CHOL/HDL Ratio: 4
Triglycerides: 149 mg/dL (ref 0.0–149.0)
VLDL: 29.8 mg/dL (ref 0.0–40.0)

## 2018-10-06 LAB — CBC
HCT: 40.2 % (ref 36.0–46.0)
Hemoglobin: 13.3 g/dL (ref 12.0–15.0)
MCHC: 33 g/dL (ref 30.0–36.0)
MCV: 91.1 fl (ref 78.0–100.0)
Platelets: 138 10*3/uL — ABNORMAL LOW (ref 150.0–400.0)
RBC: 4.41 Mil/uL (ref 3.87–5.11)
RDW: 13.9 % (ref 11.5–15.5)
WBC: 4.9 10*3/uL (ref 4.0–10.5)

## 2018-10-06 LAB — HEMOGLOBIN A1C: Hgb A1c MFr Bld: 5.2 % (ref 4.6–6.5)

## 2018-10-06 LAB — TSH: TSH: 1.97 u[IU]/mL (ref 0.35–4.50)

## 2018-10-06 NOTE — Assessment & Plan Note (Signed)
Discussed stress relieving techniques Continue Tylenol, Ibuprofen and Fioricet Will monitor

## 2018-10-06 NOTE — Patient Instructions (Signed)

## 2018-10-06 NOTE — Assessment & Plan Note (Signed)
Discussed foods that trigger reflux Discussed how weight loss could help improve reflux Continue Tums as needed

## 2018-10-06 NOTE — Progress Notes (Signed)
Virtual Visit via Video Note  I connected with Robin Little on 10/06/18 at  2:00 PM EDT by a video enabled telemedicine application and verified that I am speaking with the correct person using two identifiers.  Location: Patient: Home Provider: Office   I discussed the limitations of evaluation and management by telemedicine and the availability of in person appointments. The patient expressed understanding and agreed to proceed.  History of Present Illness:  Pt due for her annual exam. She is also due to follow up chronic conditions.  GERD: Intermittent. She takes Tums as needed with good relief of symptoms. There is no upper GI on file.  Inattention: Well controlled on Wellbutrin. She does not follow with psych.  Anxiety: Chronic but stable on Sertraline and Wellbutrin. She is not seeing a therapist. She denies SI/HI.  Frequent Headaches: Triggered by stress. Located above the right eye. She denies dizziness, visual changes, nausea or vomiting. She does have some mile sensitivity to light and sound. She takes Ibuprofen and Tylenol as needed. She takes Fioricet for severe headaches.   Flu: 12/2017 Tetanus: 06/2014 Pap Smear: 10/2016 Dentist: as needed  Diet: She does eat meat. She consumes fruits and veggies daily. She does eat some fried foods. She drinks mostly water, diet soda. Exercise: None   Past Medical History:  Diagnosis Date  . Anemia   . Anxiety   . GERD (gastroesophageal reflux disease)   . History of shingles     Current Outpatient Medications  Medication Sig Dispense Refill  . acetaminophen (TYLENOL) 500 MG tablet Take 1,000 mg by mouth every 6 (six) hours as needed for moderate pain.    Marland Kitchen buPROPion (WELLBUTRIN XL) 300 MG 24 hr tablet Take 1 tablet (300 mg total) by mouth daily. 90 tablet 1  . butalbital-acetaminophen-caffeine (FIORICET) 50-325-40 MG tablet Take 1-2 tablets by mouth every 6 (six) hours as needed for headache. 20 tablet 0  . ibuprofen  (ADVIL,MOTRIN) 800 MG tablet Take 800 mg by mouth every 8 (eight) hours as needed.    . sertraline (ZOLOFT) 100 MG tablet Take 2 tablets (200 mg total) by mouth daily. 180 tablet 3   No current facility-administered medications for this visit.     No Known Allergies  Family History  Problem Relation Age of Onset  . Hyperlipidemia Mother   . Hypertension Mother   . Arthritis Mother   . Hypothyroidism Mother   . Hyperlipidemia Father   . Hypertension Father   . Cancer Maternal Grandmother        melanoma  . Goiter Maternal Grandmother   . Cancer Paternal Grandmother        liver  . Diabetes Maternal Grandfather   . Parkinson's disease Maternal Grandfather   . Hyperlipidemia Maternal Grandfather   . Hypertension Maternal Grandfather   . Alcohol abuse Paternal Grandfather     Social History   Socioeconomic History  . Marital status: Married    Spouse name: Not on file  . Number of children: Not on file  . Years of education: Not on file  . Highest education level: Not on file  Occupational History  . Not on file  Social Needs  . Financial resource strain: Not on file  . Food insecurity:    Worry: Not on file    Inability: Not on file  . Transportation needs:    Medical: Not on file    Non-medical: Not on file  Tobacco Use  . Smoking status: Never Smoker  .  Smokeless tobacco: Never Used  Substance and Sexual Activity  . Alcohol use: Yes    Comment: social  . Drug use: No  . Sexual activity: Yes    Birth control/protection: I.U.D.  Lifestyle  . Physical activity:    Days per week: Not on file    Minutes per session: Not on file  . Stress: Not on file  Relationships  . Social connections:    Talks on phone: Not on file    Gets together: Not on file    Attends religious service: Not on file    Active member of club or organization: Not on file    Attends meetings of clubs or organizations: Not on file    Relationship status: Not on file  . Intimate partner  violence:    Fear of current or ex partner: Not on file    Emotionally abused: Not on file    Physically abused: Not on file    Forced sexual activity: Not on file  Other Topics Concern  . Not on file  Social History Narrative  . Not on file     Constitutional: Pt reports intermittent headaches. Denies fever, malaise, fatigue, or abrupt weight changes.  HEENT: Denies eye pain, eye redness, ear pain, ringing in the ears, wax buildup, runny nose, nasal congestion, bloody nose, or sore throat. Respiratory: Denies difficulty breathing, shortness of breath, cough or sputum production.   Cardiovascular: Denies chest pain, chest tightness, palpitations or swelling in the hands or feet.  Gastrointestinal: Pt reports intermittent reflux. Denies abdominal pain, bloating, constipation, diarrhea or blood in the stool.  GU: Denies urgency, frequency, pain with urination, burning sensation, blood in urine, odor or discharge. Musculoskeletal: Denies decrease in range of motion, difficulty with gait, muscle pain or joint pain and swelling.  Skin: Denies redness, rashes, lesions or ulcercations.  Neurological: Denies dizziness, difficulty with memory, difficulty with speech or problems with balance and coordination.  Psych: Pt has a history of anxiety. Denies depression, SI/HI.  No other specific complaints in a complete review of systems (except as listed in HPI above).  BP 118/76   Pulse 72   Temp 97.9 F (36.6 C) (Oral)   Ht 5\' 5"  (1.651 m)   Wt 213 lb (96.6 kg)   SpO2 98%   BMI 35.45 kg/m  Wt Readings from Last 3 Encounters:  10/06/18 213 lb (96.6 kg)  07/06/18 207 lb (93.9 kg)  04/19/18 212 lb (96.2 kg)    General: Appears her stated age, obese, in NAD. Skin: Warm, dry and intact. HEENT: Head: normal shape and size; Eyes: sclera white, EOMs intact;  Pulmonary/Chest: Normal effort. No respiratory distress.  Neurological: Alert and oriented.   Psychiatric: Mood and affect normal.  Behavior is normal. Judgment and thought content normal.   BMET    Component Value Date/Time   NA 139 09/11/2017 1446   K 4.3 09/11/2017 1446   CL 104 09/11/2017 1446   CO2 29 09/11/2017 1446   GLUCOSE 100 (H) 09/11/2017 1446   BUN 12 09/11/2017 1446   CREATININE 0.68 09/11/2017 1446   CALCIUM 8.8 09/11/2017 1446   GFRNONAA >60 03/22/2016 0419   GFRAA >60 03/22/2016 0419    Lipid Panel     Component Value Date/Time   CHOL 213 (H) 09/11/2017 1446   TRIG 210.0 (H) 09/11/2017 1446   HDL 52.50 09/11/2017 1446   CHOLHDL 4 09/11/2017 1446   VLDL 42.0 (H) 09/11/2017 1446   LDLCALC 106 (H) 09/09/2016 16100829  CBC    Component Value Date/Time   WBC 8.1 09/11/2017 1446   RBC 4.47 09/11/2017 1446   HGB 13.6 09/11/2017 1446   HCT 40.4 09/11/2017 1446   PLT 145.0 (L) 09/11/2017 1446   MCV 90.4 09/11/2017 1446   MCH 30.1 03/22/2016 0419   MCHC 33.6 09/11/2017 1446   RDW 13.5 09/11/2017 1446    Hgb A1C No results found for: HGBA1C      Assessment and Plan:  Preventative Health Maintenance:  Flu shot UTD Tetanus UTD Pap smear due 2021 Encouraged her to consume a balanced diet and exercise regimen Advised her to see a dentist annually Will check CBC, CMET, Lipid, TSH and A1C today  RTC in 1 year, sooner if needed  Follow Up Instructions:    I discussed the assessment and treatment plan with the patient. The patient was provided an opportunity to ask questions and all were answered. The patient agreed with the plan and demonstrated an understanding of the instructions.   The patient was advised to call back or seek an in-person evaluation if the symptoms worsen or if the condition fails to improve as anticipated.     Nicki Reaperegina Jebidiah Baggerly, NP

## 2018-10-06 NOTE — Assessment & Plan Note (Signed)
Continue Sertraline and Wellbutrin Support offered today Will monitor

## 2018-10-06 NOTE — Assessment & Plan Note (Signed)
Continue Wellbutrin Will monitor 

## 2018-11-22 ENCOUNTER — Other Ambulatory Visit: Payer: Self-pay | Admitting: Internal Medicine

## 2018-11-22 MED FILL — buPROPion HCL ER (XL) 300 M: 300 | 90 days supply | Qty: 90 | Fill #0

## 2018-11-22 MED FILL — SERTRALINE HCL 100 MG TAB: 100 | 90 days supply | Qty: 180 | Fill #0

## 2019-01-04 ENCOUNTER — Other Ambulatory Visit (HOSPITAL_COMMUNITY): Payer: Self-pay | Admitting: General Surgery

## 2019-01-05 ENCOUNTER — Ambulatory Visit (HOSPITAL_COMMUNITY)
Admission: RE | Admit: 2019-01-05 | Discharge: 2019-01-05 | Disposition: A | Discharge status: Home / Self Care | Payer: No Typology Code available for payment source | Source: Ambulatory Visit | Attending: General Surgery | Admitting: General Surgery

## 2019-01-05 ENCOUNTER — Other Ambulatory Visit: Payer: Self-pay

## 2019-01-05 DIAGNOSIS — E669 Obesity, unspecified: Secondary | ICD-10-CM | POA: Insufficient documentation

## 2019-02-03 ENCOUNTER — Ambulatory Visit (INDEPENDENT_AMBULATORY_CARE_PROVIDER_SITE_OTHER): Payer: No Typology Code available for payment source | Admitting: Psychology

## 2019-02-10 ENCOUNTER — Encounter: Payer: No Typology Code available for payment source | Attending: General Surgery | Admitting: Dietician

## 2019-02-10 ENCOUNTER — Encounter: Payer: Self-pay | Admitting: Dietician

## 2019-02-10 ENCOUNTER — Other Ambulatory Visit: Payer: Self-pay

## 2019-02-10 ENCOUNTER — Ambulatory Visit: Payer: No Typology Code available for payment source | Admitting: Psychology

## 2019-02-10 DIAGNOSIS — E669 Obesity, unspecified: Secondary | ICD-10-CM | POA: Diagnosis present

## 2019-02-10 NOTE — Progress Notes (Signed)
Nutrition Assessment for Bariatric Surgery Medical Nutrition Therapy  Appt Start Time: 7:30am    End Time: 8:05am  Patient was seen on 02/10/2019 for Pre-Operative Nutrition Assessment. Letter of approval faxed to [facility] bariatric surgery program coordinator on 02/10/2019.   Referral stated Supervised Weight Loss (SWL) visits needed: 0  Planned surgery: Sleeve Pt expectation of surgery: to lose weight and keep it off, live a longer healthier life, be a good example for kids to establish good habits with them early on   NUTRITION ASSESSMENT   Anthropometrics  Start weight at [facility]: 220 lbs (date: 02/10/2019) Height: 65 in BMI: 36.6 kg/m2    Clinical  Medical hx: obesity, GERD, anxiety, anemia, hyperlipidemia, IBS-D Medications: zoloft, wellbutrin    Lifestyle & Dietary Hx Patient lives with her husband and their 2 children (31 and 61 years old.) Works for [employer] as a Engineer, civil (consulting), 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 Busy lifestyle with working 12 hour shifts, having young kids, and finishing up school. States she has a few friends who have had bariatric surgery.   Typical meal pattern is 2-3 meals plus 1-2 snacks per day. Usually dinner is chicken and 2 vegetables. States she does not eat after dinner, which is usually around 5 or 6pm. Snacks include apples and peanut butter, chips, peanut butter crackers. Suspects lactose intolerance. States she likes sweets. Physical activity includes lots of walking/steps at work, otherwise no "structured" activity outside of work. Takes a prenatal supplement.   24-Hr Dietary Recall First Meal: poptart (or bagel)  Snack: peanut butter crackers Second Meal: [employer] soup + brownie  Snack: - Third Meal: hamburger  Snack: - Beverages: diet Pepsi, water   Estimated Energy Needs Calories: 1800 Carbohydrate: 200g Protein: 113g Fat: 60g   NUTRITION DIAGNOSIS  Overweight/obesity ([place]-3.3) related to past poor dietary habits  and physical inactivity as evidenced by patient w/ planned Sleeve Gastrectomy surgery following dietary guidelines for continued weight loss.    NUTRITION INTERVENTION  Nutrition counseling (C-1) and education (E-2) to facilitate bariatric surgery goals.  Pre-Op Goals Reviewed with the Patient . Track food and beverage intake (pen and paper, MyFitness Pal, Baritastic app, etc.) . Make healthy food choices while monitoring portion sizes . Consume 3 meals per day or try to eat every 3-5 hours . Avoid concentrated sugars and fried foods . Keep sugar & fat in the single digits per serving on food labels . Practice CHEWING your food (aim for applesauce consistency) . Practice not drinking 15 minutes before, during, and 30 minutes after each meal and snack . Avoid all carbonated beverages (ex: soda, sparkling beverages)  . Limit caffeinated beverages (ex: coffee, tea, energy drinks) . Avoid all sugar-sweetened beverages (ex: regular soda, sports drinks)  . Avoid alcohol  . Aim for 64-100 ounces of FLUID daily (with at least half of fluid intake being plain water)  . Aim for at least 60-80 grams of PROTEIN daily . Look for a liquid protein source that contains ?15 g protein and ?5 g carbohydrate (ex: shakes, drinks, shots) . Make a list of non-food related activities . Physical activity is an important part of a healthy lifestyle so keep it moving! The goal is to reach 150 minutes of exercise per week, including cardiovascular and weight baring activity.  Handouts Provided Include  . Bariatric Surgery handouts (Nutrition Visits, Pre-Op Goals, Protein Shakes, Vitamins & Minerals)  Learning Style & Readiness for Change Teaching method utilized: Visual & Auditory  Demonstrated degree of understanding  via: Teach Back  Barriers to learning/adherence to lifestyle change: None Identified   MONITORING & EVALUATION Dietary intake, weekly physical activity, body weight, and pre-op goals reached at  next nutrition visit.   Next Steps Patient is to call [facility] to enroll in Pre-Op Class (>2 weeks before surgery) and Post-Op Class (2 weeks after surgery) for further nutrition education when surgery date is scheduled.

## 2019-02-21 ENCOUNTER — Other Ambulatory Visit: Payer: Self-pay | Admitting: Internal Medicine

## 2019-02-21 MED FILL — buPROPion HCL ER (XL) 300 M: 300 | 90 days supply | Qty: 90 | Fill #0

## 2019-02-21 MED FILL — SERTRALINE HCL 100 MG TAB: 100 | 90 days supply | Qty: 180 | Fill #0

## 2019-03-31 ENCOUNTER — Other Ambulatory Visit: Payer: Self-pay | Admitting: Internal Medicine

## 2019-04-01 MED ORDER — BUTALBITAL-APAP-CAFFEINE 50-325-40 MG PO TABS: 1.0000 | ORAL_TABLET | Freq: Four times a day (QID) | ORAL | 0 refills | Status: DC | PRN

## 2019-04-01 NOTE — Telephone Encounter (Signed)
Last filled 10/05/2018.Marland KitchenMarland KitchenMarland Kitchen please advise

## 2019-04-27 ENCOUNTER — Ambulatory Visit (INDEPENDENT_AMBULATORY_CARE_PROVIDER_SITE_OTHER): Payer: No Typology Code available for payment source | Admitting: Psychology

## 2019-04-27 DIAGNOSIS — F509 Eating disorder, unspecified: Secondary | ICD-10-CM

## 2019-05-02 ENCOUNTER — Encounter: Payer: Self-pay | Admitting: Internal Medicine

## 2019-05-18 ENCOUNTER — Ambulatory Visit: Payer: Self-pay | Admitting: General Surgery

## 2019-05-18 NOTE — H&P (Signed)
JAUNICE MIRZA Documented: 05/18/2019 11:04 AM Location: [facility] Patient #: [id number] DOB: 08/04/1985 Married / Language: Cleophus Molt / Race: White Female  History of Present Illness Randall Hiss M. Wilson MD; 05/18/2019 11:28 AM) The patient is a 34 year old female who presents for a bariatric surgery evaluation. She comes in today for long-term follow-up regarding her obesity, hyperlipidemia, hypercholesterolemia and fatty liver. I initially met her in September to discuss weight loss surgery. She has completed her bariatric surgery pathway. She has received psychological clearance. She denies any medical changes since I saw her in September. She denies any trips the emergency room hospital. She denies any chest pain, chest pressure, shortness of breath, orthopnea, dyspnea on exertion, abdominal issues. She denies any vision changes.  12/30/2018  She is referred by Webb Silversmith NP for evaluation of weight loss surgery. She completed our seminar on line. She is interested in sleeve gastrectomy. He is interested in improving her overall health in order to be more active. Despite numerous attempts for sustained weight loss she is been unsuccessful. She has tried Weight Watchers on several occasions, Atkins, and ketogenic diet all without any long-term success.  Her comorbidities include hypercholesterolemia, hyperlipidemia, fatty liver  She denies any chest pain, chest pressure, source of breath, orthopnea, dyspnea on exertion. She denies any peripheral edema, blood clots. Her sleep apnea questionnaire was negative. She has very rare reflux in it is only related to certain foods. She denies any abdominal pain, diarrhea, melena or hematochezia. She does have infrequent stools and averages a bowel movement every other day. She denies any dysuria or hematuria. Her periods are regular. Her husband has had a vasectomy. She has some chronic low back pain without sciatica. She denies  any diagnosis of diabetes. She may have a migraine around her cycle. She denies any tobacco or drug use. She drinks alcohol on a social occasion.  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  She had an annual physical about a month and a half ago. CBC was unremarkable except for a platelet count of around 140,000 which is stable for her. Hemoglobin A1c was 5.2. Total cholesterol 208. Triglyceride 149, LDL 130. Comprehensive metabolic panel was within normal limits. TSH within normal limits. She has had prior abdominal imaging which showed hepatic steatosis  Her mother and sister had diabetes mellitus and hypertension   Problem List/Past Medical Randall Hiss M. 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 Redmond Pulling, MD; 05/18/2019 11:28 AM) No pertinent past surgical history  Diagnostic Studies History Randall Hiss M. Redmond Pulling, MD; 05/18/2019 11:28 AM) Colonoscopy >10 years ago Mammogram never Pap Smear 1-5 years ago  Allergies ([place], [place]; 05/18/2019 11:05 AM) No Known Drug Allergies [12/30/2018]: Allergies Reconciled  Medication History (Chanel Teressa Senter, CMA; 05/18/2019 11:05 AM) Sertraline HCl (Oral) Specific strength unknown - Active. No Current Medications (Taken starting 05/18/2019) buPROPion HCl ER (XL) (300MG Tablet ER 24HR, Oral) Active. Medications Reconciled  Social History Randall Hiss M. Redmond Pulling, MD; 05/18/2019 11:28 AM) Alcohol use Occasional alcohol use. Caffeine use Carbonated beverages. No drug use Tobacco use Never smoker.  Family History Randall Hiss M. Redmond Pulling, MD; 05/18/2019 11:28 AM) Alcohol Abuse Father. Arthritis Mother. Colon Polyps Sister. Depression Sister. Hypertension Father, Mother. Migraine Headache Sister. Thyroid problems Mother, Sister.  Pregnancy / Birth History Randall Hiss M. Redmond Pulling, MD; 05/18/2019 11:28  AM) Age at menarche 30 years. Gravida 2 Length (months) of breastfeeding 12-24 Maternal age  26-30 Para 2 Regular periods  Other Problems Randall Hiss M. Redmond Pulling, MD; 05/18/2019 11:28 AM) Anxiety Disorder Back Pain Depression Gastroesophageal Reflux Disease Hemorrhoids Hypercholesterolemia Migraine Headache Other disease, cancer, significant illness     Review of Systems Randall Hiss M. Wilson MD; 05/18/2019 11:23 AM) General Present- Weight Gain. Not Present- Appetite Loss, Chills, Fatigue, Fever, Night Sweats and Weight Loss. Skin Not Present- Change in Wart/Mole, Dryness, Hives, Jaundice, New Lesions, Non-Healing Wounds, Rash and Ulcer. HEENT Present- Seasonal Allergies and Wears glasses/contact lenses. Not Present- Earache, Hearing Loss, Hoarseness, Nose Bleed, Oral Ulcers, Ringing in the Ears, Sinus Pain, Sore Throat, Visual Disturbances and Yellow Eyes. Respiratory Not Present- Bloody sputum, Chronic Cough, Difficulty Breathing, Snoring and Wheezing. Breast Not Present- Breast Mass, Breast Pain, Nipple Discharge and Skin Changes. Cardiovascular Not Present- Chest Pain, Difficulty Breathing Lying Down, Leg Cramps, Palpitations, Rapid Heart Rate, Shortness of Breath and Swelling of Extremities. Gastrointestinal Present- Change in Bowel Habits, Excessive gas, Hemorrhoids and Indigestion. Not Present- Abdominal Pain, Bloating, Bloody Stool, Chronic diarrhea, Constipation, Difficulty Swallowing, Gets full quickly at meals, Nausea, Rectal Pain and Vomiting. Female Genitourinary Not Present- Frequency, Nocturia, Painful Urination, Pelvic Pain and Urgency. Musculoskeletal Present- Back Pain. Not Present- Joint Pain, Joint Stiffness, Muscle Pain, Muscle Weakness and Swelling of Extremities. Neurological Present- Headaches. Not Present- Decreased Memory, Fainting, Numbness, Seizures, Tingling, Tremor, Trouble walking and Weakness. Psychiatric Present- Anxiety and Depression. Not Present-  Bipolar, Change in Sleep Pattern, Fearful and Frequent crying. Endocrine Present- Hair Changes and Hot flashes. Not Present- Cold Intolerance, Excessive Hunger, Heat Intolerance and New Diabetes. Hematology Present- Easy Bruising. Not Present- Blood Thinners, Excessive bleeding, Gland problems, HIV and Persistent Infections.  Vitals (Chanel Nolan CMA; 05/18/2019 11:06 AM) 05/18/2019 11:05 AM Weight: 221 lb Height: 65in Body Surface Area: 2.06 m Body Mass Index: 36.78 kg/m  Temp.: 97.75F  Pulse: 83 (Regular)  BP: 115/72 (Sitting, Left Arm, Standard)        Physical Exam Randall Hiss M. Wilson MD; 05/18/2019 11:22 AM)  General Mental Status-Alert. General Appearance-Consistent with stated age. Hydration-Well hydrated. Voice-Normal. Note: obese, pear shaped  Head and Neck Head-normocephalic, atraumatic with no lesions or palpable masses. Trachea-midline. Thyroid Gland Characteristics - normal size and consistency.  Eye Eyeball - Bilateral-Extraocular movements intact. Sclera/Conjunctiva - Bilateral-No scleral icterus.  ENMT Ears Pinna - Bilateral - no bony growth in lateral aspect of ear canal, no edema. Nose and Sinuses External Inspection of the Nose - symmetric, no deformities observed.  Chest and Lung Exam Chest and lung exam reveals -quiet, even and easy respiratory effort with no use of accessory muscles and on auscultation, normal breath sounds, no adventitious sounds and normal vocal resonance. Inspection Chest Wall - Normal. Back - normal.  Breast - Did not examine.  Cardiovascular Cardiovascular examination reveals -normal heart sounds, regular rate and rhythm with no murmurs and normal pedal pulses bilaterally.  Abdomen Inspection Inspection of the abdomen reveals - No Hernias. Skin - Scar - no surgical scars. Palpation/Percussion Palpation and Percussion of the abdomen reveal - Soft, Non Tender, No Rebound tenderness, No  Rigidity (guarding) and No hepatosplenomegaly. Auscultation Auscultation of the abdomen reveals - Bowel sounds normal.  Peripheral Vascular Upper Extremity Palpation - Pulses bilaterally normal.  Neurologic Neurologic evaluation reveals -alert and oriented x 3 with no impairment of recent or remote memory. Mental Status-Normal.  Neuropsychiatric The patient's mood and affect are described as -normal. Judgment and Insight-insight is appropriate concerning matters relevant to self.  Musculoskeletal Normal Exam - Left-Upper Extremity Strength Normal  and Lower Extremity Strength Normal. Normal Exam - Right-Upper Extremity Strength Normal and Lower Extremity Strength Normal.  Lymphatic Head & Neck  General Head & Neck Lymphatics: Bilateral - Description - Normal. Axillary - Did not examine. Femoral & Inguinal - Did not examine.    Assessment & Plan Randall Hiss M. Wilson MD; 05/18/2019 11:27 AM)  OBESITY, CLASS II, BMI 35-39.9 (E66.9) Impression: The patient meets weight loss surgery criteria. I think the patient would be an acceptable candidate for Laparoscopic vertical sleeve gastrectomy.  We rediscussed LSG. we reviewed her preop workup. I reviewed her EKG from 12/2018 and her psychological consultation notes from oct and dec 2020. We discussed the typical recovery. We discussed that we will attempt to do her surgery at [facility]. I think she is safe to have surgery at the ambulatory surgery Center. we rediscussed the typical issues we see postop.  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 Redmond Pulling, MD, FACS General,  Bariatric, & Minimally Invasive Surgery [facility], [place]

## 2019-05-31 MED FILL — SERTRALINE HCL 100 MG TAB: 100 | 90 days supply | Qty: 180 | Fill #1

## 2019-05-31 MED FILL — BUPROPION HCL ER (XL) 300 M: 300 | 90 days supply | Qty: 90 | Fill #1

## 2019-07-04 ENCOUNTER — Encounter: Payer: No Typology Code available for payment source | Attending: General Surgery | Admitting: Skilled Nursing Facility1

## 2019-07-04 ENCOUNTER — Other Ambulatory Visit: Payer: Self-pay

## 2019-07-04 DIAGNOSIS — E669 Obesity, unspecified: Secondary | ICD-10-CM | POA: Diagnosis present

## 2019-07-04 NOTE — Progress Notes (Signed)
Pre-Operative Nutrition Class:  Appt start time: 2449   End time:  1830.  Patient was seen on 07/04/2019 for Pre-Operative Bariatric Surgery [facility]   Surgery date:  Surgery type: sleeve Start weight at [facility]: 220 Weight today: 221.4  Samples given per MNT protocol. Patient educated on appropriate usage: Bariatric Advantage Multivitamin Lot #[id number] Exp:08/21  Bariatric Advantage Calcium  Lot #[id number] Exp:04/30/2020  Protein Shake Lot #[id number] Exp:09/12/2020   The following the learning objectives were met by the patient during this course:  Identify Pre-Op Dietary Goals and will begin 2 weeks pre-operatively  Identify appropriate sources of fluids and proteins   State protein recommendations and appropriate sources pre and post-operatively  Identify Post-Operative Dietary Goals and will follow for 2 weeks post-operatively  Identify appropriate multivitamin and calcium sources  Describe the need for physical activity post-operatively and will follow MD recommendations  State when to call healthcare provider regarding medication questions or post-operative complications  Handouts given during class include:  Pre-Op Bariatric Surgery Diet Handout  Protein Shake Handout  Post-Op Bariatric Surgery Nutrition Handout  [facility] Program Information Flyer  Support Group Information Flyer  [facility] Bariatric Supplements Price List  Follow-Up Plan: Patient will follow-up at [facility] 2 weeks post operatively for diet advancement per MD.

## 2019-08-08 NOTE — Progress Notes (Signed)
PCP - Nicki Reaper ,NP Cardiologist -   Chest x-ray -  EKG - 01-05-19 epic Stress Test -  ECHO -  Cardiac Cath -   Sleep Study -  CPAP -   Fasting Blood Sugar -  Checks Blood Sugar _____ times a day  Blood Thinner Instructions: Aspirin Instructions: Last Dose:  Anesthesia review:   Patient denies shortness of breath, fever, cough and chest pain at PAT appointment   none   Patient verbalized understanding of instructions that were given to them at the PAT appointment. Patient was also instructed that they will need to review over the PAT instructions again at home before surgery.

## 2019-08-08 NOTE — Patient Instructions (Addendum)
DUE TO COVID-19 ONLY ONE VISITOR IS ALLOWED TO COME WITH YOU AND STAY IN THE WAITING ROOM ONLY DURING PRE OP AND PROCEDURE DAY OF SURGERY. TWO  VISITORS  MAY VISIT WITH YOU AFTER SURGERY IN YOUR PRIVATE ROOM DURING VISITING HOURS ONLY! 10a-8p  YOU NEED TO HAVE A COVID 19 TEST ON__4-15-21_____ @_4 :00______, THIS TEST MUST BE DONE BEFORE SURGERY, COME      To Robin Little  ONCE YOUR COVID TEST IS COMPLETED, PLEASE BEGIN THE QUARANTINE INSTRUCTIONS AS OUTLINED IN YOUR HANDOUT.                Robin Little  08/08/2019   Your procedure is scheduled on: 08-15-19    Report to [facility] Main  Entrance   Report to admitting at     0945 AM     Call this number if you have problems the morning of surgery  [phone]    Remember: MORNING OF SURGERY DRINK:   DRINK 1 G2 drink BEFORE YOU LEAVE HOME, DRINK ALL OF THE  G2 DRINK AT ONE TIME.   NO SOLID FOOD AFTER 600 PM THE NIGHT BEFORE YOUR SURGERY. YOU MAY DRINK CLEAR FLUIDS. THE G2 DRINK YOU DRINK BEFORE YOU LEAVE HOME WILL BE THE LAST FLUIDS YOU DRINK BEFORE SURGERY.  PAIN IS EXPECTED AFTER SURGERY AND WILL NOT BE COMPLETELY ELIMINATED. AMBULATION AND TYLENOL WILL HELP REDUCE INCISIONAL AND GAS PAIN. MOVEMENT IS KEY!  YOU ARE EXPECTED TO BE OUT OF BED WITHIN 4 HOURS OF ADMISSION TO YOUR PATIENT ROOM.  SITTING IN THE RECLINER THROUGHOUT THE DAY IS IMPORTANT FOR DRINKING FLUIDS AND MOVING GAS THROUGHOUT THE GI TRACT.  COMPRESSION STOCKINGS SHOULD BE WORN [facility] STAY UNLESS YOU ARE WALKING.   INCENTIVE SPIROMETER SHOULD BE USED EVERY HOUR WHILE AWAKE TO DECREASE POST-OPERATIVE COMPLICATIONS SUCH AS PNEUMONIA.  WHEN DISCHARGED HOME, IT IS IMPORTANT TO CONTINUE TO WALK EVERY HOUR AND USE THE INCENTIVE SPIROMETER EVERY HOUR.      CLEAR LIQUID DIET   Foods Allowed                                                                                       Foods Excluded  Coffee and tea, regular and decaf     No creamer                                        liquids that you cannot  Plain Jell-O any favor except red or purple                                           see through such as: Fruit ices (not with fruit pulp)  milk, soups, orange juice  Iced Popsicles                                                                     All solid food                                Cranberry, grape and apple juices Sports drinks like Gatorade Lightly seasoned clear broth or consume(fat free) Sugar, honey syrup  Sample Menu Breakfast                                Lunch                                     Supper Cranberry juice                    Beef broth                            Chicken broth Jell-O                                     Grape juice                           Apple juice Coffee or tea                        Jell-O                                      Popsicle                                                Coffee or tea                        Coffee or tea  _____________________________________________________________________          BRUSH YOUR TEETH MORNING OF SURGERY AND RINSE YOUR MOUTH OUT, NO CHEWING GUM CANDY OR MINTS.     Take these medicines the morning of surgery with A SIP OF WATER: zoloft, zyrtec, wellbutrin              You may not have any metal on your body including hair pins and              piercings  Do not wear jewelry, make-up, lotions, powders or perfumes, deodorant             Do not wear nail polish on your fingernails.  Do not shave  48 hours prior to surgery.           Do  not bring valuables to the hospital. [facility] IS NOT             RESPONSIBLE   FOR VALUABLES.  Contacts, dentures or bridgework may not be worn into surgery.             [facility] - Preparing for Surgery Before surgery, you can play an important role.  Because skin is not sterile, your skin needs to be as free of germs as  possible.  You can reduce the number of germs on your skin by washing with CHG (chlorahexidine gluconate) soap before surgery.  CHG is an antiseptic cleaner which kills germs and bonds with the skin to continue killing germs even after washing. Please DO NOT use if you have an allergy to CHG or antibacterial soaps.  If your skin becomes reddened/irritated stop using the CHG and inform your nurse when you arrive at Short Stay. Do not shave (including legs and underarms) for at least 48 hours prior to the first CHG shower.  You may shave your face/neck. Please follow these instructions carefully:  1.  Shower with CHG Soap the night before surgery and the  morning of Surgery.  2.  If you choose to wash your hair, wash your hair first as usual with your  normal  shampoo.  3.  After you shampoo, rinse your hair and body thoroughly to remove the  shampoo.                           4.  Use CHG as you would any other liquid soap.  You can apply chg directly  to the skin and wash                       Gently with a scrungie or clean washcloth.  5.  Apply the CHG Soap to your body ONLY FROM THE NECK DOWN.   Do not use on face/ open                           Wound or open sores. Avoid contact with eyes, ears mouth and genitals (private parts).                       Wash face,  Genitals (private parts) with your normal soap.             6.  Wash thoroughly, paying special attention to the area where your surgery  will be performed.  7.  Thoroughly rinse your body with warm water from the neck down.  8.  DO NOT shower/wash with your normal soap after using and rinsing off  the CHG Soap.                9.  Pat yourself dry with a clean towel.            10.  Wear clean pajamas.            11.  Place clean sheets on your bed the night of your first shower and do not  sleep with pets. Day of Surgery : Do not apply any lotions/deodorants the morning of surgery.  Please wear clean clothes to the hospital/surgery  center.  FAILURE TO FOLLOW THESE INSTRUCTIONS MAY RESULT IN THE CANCELLATION OF YOUR SURGERY PATIENT SIGNATURE_________________________________  NURSE SIGNATURE__________________________________  ________________________________________________________________________   Rogelia MireIncentive Spirometer  An incentive  spirometer is a tool that can help keep your lungs clear and active. This tool measures how well you are filling your lungs with each breath. Taking long deep breaths may help reverse or decrease the chance of developing breathing (pulmonary) problems (especially infection) following:  A long period of time when you are unable to move or be active. BEFORE THE PROCEDURE   If the spirometer includes an indicator to show your best effort, your nurse or respiratory therapist will set it to a desired goal.  If possible, sit up straight or lean slightly forward. Try not to slouch.  Hold the incentive spirometer in an upright position. INSTRUCTIONS FOR USE  1. Sit on the edge of your bed if possible, or sit up as far as you can in bed or on a chair. 2. Hold the incentive spirometer in an upright position. 3. Breathe out normally. 4. Place the mouthpiece in your mouth and seal your lips tightly around it. 5. Breathe in slowly and as deeply as possible, raising the piston or the ball toward the top of the column. 6. Hold your breath for 3-5 seconds or for as long as possible. Allow the piston or ball to fall to the bottom of the column. 7. Remove the mouthpiece from your mouth and breathe out normally. 8. Rest for a few seconds and repeat Steps 1 through 7 at least 10 times every 1-2 hours when you are awake. Take your time and take a few normal breaths between deep breaths. 9. The spirometer may include an indicator to show your best effort. Use the indicator as a goal to work toward during each repetition. 10. After each set of 10 deep breaths, practice coughing to be sure your lungs are  clear. If you have an incision (the cut made at the time of surgery), support your incision when coughing by placing a pillow or rolled up towels firmly against it. Once you are able to get out of bed, walk around indoors and cough well. You may stop using the incentive spirometer when instructed by your caregiver.  RISKS AND COMPLICATIONS  Take your time so you do not get dizzy or light-headed.  If you are in pain, you may need to take or ask for pain medication before doing incentive spirometry. It is harder to take a deep breath if you are having pain. AFTER USE  Rest and breathe slowly and easily.  It can be helpful to keep track of a log of your progress. Your caregiver can provide you with a simple table to help with this. If you are using the spirometer at home, follow these instructions: SEEK MEDICAL CARE IF:   You are having difficultly using the spirometer.  You have trouble using the spirometer as often as instructed.  Your pain medication is not giving enough relief while using the spirometer.  You develop fever of 100.5 F (38.1 C) or higher. SEEK IMMEDIATE MEDICAL CARE IF:   You cough up bloody sputum that had not been present before.  You develop fever of 102 F (38.9 C) or greater.  You develop worsening pain at or near the incision site. MAKE SURE YOU:   Understand these instructions.  Will watch your condition.  Will get help right away if you are not doing well or get worse. Document Released: 08/25/2006 Document Revised: 07/07/2011 Document Reviewed: 10/26/2006 [employer] Patient Information 2014 [employer], [place].   ________________________________________________________________________  WHAT IS A BLOOD TRANSFUSION? Blood Transfusion Information  A transfusion is  the replacement of blood or some of its parts. Blood is made up of multiple cells which provide different functions.  Red blood cells carry oxygen and are used for blood loss  replacement.  White blood cells fight against infection.  Platelets control bleeding.  Plasma helps clot blood.  Other blood products are available for specialized needs, such as hemophilia or other clotting disorders. BEFORE THE TRANSFUSION  Who gives blood for transfusions?   Healthy volunteers who are fully evaluated to make sure their blood is safe. This is blood bank blood. Transfusion therapy is the safest it has ever been in the practice of medicine. Before blood is taken from a donor, a complete history is taken to make sure that person has no history of diseases nor engages in risky social behavior (examples are intravenous drug use or sexual activity with multiple partners). The donor's travel history is screened to minimize risk of transmitting infections, such as malaria. The donated blood is tested for signs of infectious diseases, such as HIV and hepatitis. The blood is then tested to be sure it is compatible with you in order to minimize the chance of a transfusion reaction. If you or a relative donates blood, this is often done in anticipation of surgery and is not appropriate for emergency situations. It takes many days to process the donated blood. RISKS AND COMPLICATIONS Although transfusion therapy is very safe and saves many lives, the main dangers of transfusion include:   Getting an infectious disease.  Developing a transfusion reaction. This is an allergic reaction to something in the blood you were given. Every precaution is taken to prevent this. The decision to have a blood transfusion has been considered carefully by your caregiver before blood is given. Blood is not given unless the benefits outweigh the risks. AFTER THE TRANSFUSION  Right after receiving a blood transfusion, you will usually feel much better and more energetic. This is especially true if your red blood cells have gotten low (anemic). The transfusion raises the level of the red blood cells which  carry oxygen, and this usually causes an energy increase.  The nurse administering the transfusion will monitor you carefully for complications. HOME CARE INSTRUCTIONS  No special instructions are needed after a transfusion. You may find your energy is better. Speak with your caregiver about any limitations on activity for underlying diseases you may have. SEEK MEDICAL CARE IF:   Your condition is not improving after your transfusion.  You develop redness or irritation at the intravenous (IV) site. SEEK IMMEDIATE MEDICAL CARE IF:  Any of the following symptoms occur over the next 12 hours:  Shaking chills.  You have a temperature by mouth above 102 F (38.9 C), not controlled by medicine.  Chest, back, or muscle pain.  People around you feel you are not acting correctly or are confused.  Shortness of breath or difficulty breathing.  Dizziness and fainting.  You get a rash or develop hives.  You have a decrease in urine output.  Your urine turns a dark color or changes to pink, red, or brown. Any of the following symptoms occur over the next 10 days:  You have a temperature by mouth above 102 F (38.9 C), not controlled by medicine.  Shortness of breath.  Weakness after normal activity.  The white part of the eye turns yellow (jaundice).  You have a decrease in the amount of urine or are urinating less often.  Your urine turns a dark color or changes  to pink, red, or brown. Document Released: 04/11/2000 Document Revised: 07/07/2011 Document Reviewed: 11/29/2007 [facility] Patient Information 2014 [place], [place].  _______________________________________________________________________

## 2019-08-09 ENCOUNTER — Encounter (HOSPITAL_COMMUNITY): Payer: Self-pay

## 2019-08-09 ENCOUNTER — Other Ambulatory Visit: Payer: Self-pay

## 2019-08-09 ENCOUNTER — Encounter (HOSPITAL_COMMUNITY)
Admission: RE | Admit: 2019-08-09 | Discharge: 2019-08-09 | Disposition: A | Discharge status: Home / Self Care | Payer: No Typology Code available for payment source | Source: Ambulatory Visit | Attending: General Surgery | Admitting: General Surgery

## 2019-08-09 DIAGNOSIS — Z01812 Encounter for preprocedural laboratory examination: Secondary | ICD-10-CM | POA: Diagnosis not present

## 2019-08-09 HISTORY — DX: Pneumonia, unspecified organism: J18.9

## 2019-08-09 HISTORY — DX: Depression, unspecified: F32.A

## 2019-08-09 HISTORY — DX: Headache, unspecified: R51.9

## 2019-08-10 ENCOUNTER — Encounter (HOSPITAL_COMMUNITY)
Admission: RE | Admit: 2019-08-10 | Discharge: 2019-08-10 | Disposition: A | Discharge status: Home / Self Care | Payer: No Typology Code available for payment source | Source: Ambulatory Visit | Attending: General Surgery | Admitting: General Surgery

## 2019-08-10 DIAGNOSIS — Z01812 Encounter for preprocedural laboratory examination: Secondary | ICD-10-CM | POA: Diagnosis not present

## 2019-08-10 LAB — COMPREHENSIVE METABOLIC PANEL
ALT: 17 U/L (ref 0–44)
AST: 17 U/L (ref 15–41)
Albumin: 4.2 g/dL (ref 3.5–5.0)
Alkaline Phosphatase: 68 U/L (ref 38–126)
Anion gap: 10 (ref 5–15)
BUN: 12 mg/dL (ref 6–20)
CO2: 25 mmol/L (ref 22–32)
Calcium: 8.6 mg/dL — ABNORMAL LOW (ref 8.9–10.3)
Chloride: 104 mmol/L (ref 98–111)
Creatinine, Ser: 0.79 mg/dL (ref 0.44–1.00)
GFR calc Af Amer: >60 mL/min (ref >60)
GFR calc non Af Amer: >60 mL/min (ref >60)
Glucose, Bld: 88 mg/dL (ref 70–99)
Potassium: 4.3 mmol/L (ref 3.5–5.1)
Sodium: 139 mmol/L (ref 135–145)
Total Bilirubin: 0.5 mg/dL (ref 0.3–1.2)
Total Protein: 7.6 g/dL (ref 6.5–8.1)

## 2019-08-10 LAB — CBC WITH DIFFERENTIAL/PLATELET
Abs Immature Granulocytes: 0.02 10*3/uL (ref 0.00–0.07)
Basophils Absolute: 0 10*3/uL (ref 0.0–0.1)
Basophils Relative: 0 %
Eosinophils Absolute: 0.1 10*3/uL (ref 0.0–0.5)
Eosinophils Relative: 2 %
HCT: 42.8 % (ref 36.0–46.0)
Hemoglobin: 13.5 g/dL (ref 12.0–15.0)
Immature Granulocytes: 0 %
Lymphocytes Relative: 27 %
Lymphs Abs: 1.5 10*3/uL (ref 0.7–4.0)
MCH: 30 pg (ref 26.0–34.0)
MCHC: 31.5 g/dL (ref 30.0–36.0)
MCV: 95.1 fL (ref 80.0–100.0)
Monocytes Absolute: 0.4 10*3/uL (ref 0.1–1.0)
Monocytes Relative: 8 %
Neutro Abs: 3.5 10*3/uL (ref 1.7–7.7)
Neutrophils Relative %: 63 %
Platelets: 163 10*3/uL (ref 150–400)
RBC: 4.5 MIL/uL (ref 3.87–5.11)
RDW: 12.8 % (ref 11.5–15.5)
WBC: 5.6 10*3/uL (ref 4.0–10.5)
nRBC: 0 % (ref 0.0–0.2)

## 2019-08-10 LAB — ABO/RH: ABO/RH(D): A POS

## 2019-08-11 ENCOUNTER — Other Ambulatory Visit (HOSPITAL_COMMUNITY)
Admission: RE | Admit: 2019-08-11 | Discharge: 2019-08-11 | Disposition: A | Discharge status: Home / Self Care | Payer: No Typology Code available for payment source | Source: Ambulatory Visit | Attending: General Surgery | Admitting: General Surgery

## 2019-08-11 ENCOUNTER — Other Ambulatory Visit: Payer: Self-pay

## 2019-08-11 DIAGNOSIS — Z01812 Encounter for preprocedural laboratory examination: Secondary | ICD-10-CM | POA: Insufficient documentation

## 2019-08-11 DIAGNOSIS — Z20822 Contact with and (suspected) exposure to covid-19: Secondary | ICD-10-CM | POA: Diagnosis not present

## 2019-08-12 ENCOUNTER — Ambulatory Visit (INDEPENDENT_AMBULATORY_CARE_PROVIDER_SITE_OTHER): Payer: No Typology Code available for payment source | Admitting: Psychology

## 2019-08-12 LAB — SARS CORONAVIRUS 2 (TAT 6-24 HRS): SARS Coronavirus 2: NEGATIVE

## 2019-08-15 ENCOUNTER — Encounter (HOSPITAL_COMMUNITY): Admission: RE | Payer: Self-pay | Source: Home / Self Care

## 2019-08-15 ENCOUNTER — Inpatient Hospital Stay (HOSPITAL_COMMUNITY)
Admission: RE | Admit: 2019-08-15 | Payer: No Typology Code available for payment source | Source: Home / Self Care | Admitting: General Surgery

## 2019-08-15 LAB — TYPE AND SCREEN
ABO/RH(D): A POS
Antibody Screen: NEGATIVE

## 2019-08-15 SURGERY — GASTRECTOMY, SLEEVE, LAPAROSCOPIC
Anesthesia: General

## 2019-08-17 ENCOUNTER — Encounter: Payer: Self-pay | Admitting: Internal Medicine

## 2019-08-25 ENCOUNTER — Ambulatory Visit: Payer: Self-pay | Admitting: General Surgery

## 2019-08-25 NOTE — Patient Instructions (Signed)
DUE TO COVID-19 ONLY ONE VISITOR IS ALLOWED TO COME WITH YOU AND STAY IN THE WAITING ROOM ONLY DURING PRE OP AND PROCEDURE DAY OF SURGERY. THE 2 VISITORS MAY VISIT WITH YOU AFTER SURGERY IN YOUR PRIVATE ROOM DURING VISITING HOURS ONLY!  YOU NEED TO HAVE A COVID 19 TEST ON_4/30______ @_2 :05______, THIS TEST MUST BE DONE BEFORE SURGERY, COME  [address], [place] , .  [facility]) ONCE YOUR COVID TEST IS COMPLETED, PLEASE BEGIN THE QUARANTINE INSTRUCTIONS AS OUTLINED IN YOUR HANDOUT.                KENNLEY SCHWANDT   Your procedure is scheduled on: 08/29/19   Report to [facility] Main  Entrance   Report to admitting at  9:45 AM     Call this number if you have problems the morning of surgery [phone]     BRUSH YOUR TEETH MORNING OF SURGERY AND RINSE YOUR MOUTH OUT, NO CHEWING GUM CANDY OR MINTS.   MORNING OF SURGERY DRINK:   DRINK 1 G2 drink BEFORE YOU LEAVE HOME, DRINK ALL OF THE  G2 DRINK AT ONE TIME.    NO SOLID FOOD AFTER 6:00 PM THE NIGHT BEFORE YOUR SURGERY.   YOU MAY DRINK CLEAR FLUIDS. THE G2 DRINK YOU DRINK BEFORE YOU LEAVE HOME WILL BE THE LAST FLUIDS YOU DRINK BEFORE SURGERY.  PAIN IS EXPECTED AFTER SURGERY AND WILL NOT BE COMPLETELY ELIMINATED.   AMBULATION AND TYLENOL WILL HELP REDUCE INCISIONAL AND GAS PAIN. MOVEMENT IS KEY!  YOU ARE EXPECTED TO BE OUT OF BED WITHIN 4 HOURS OF ADMISSION TO YOUR PATIENT ROOM.  SITTING IN THE RECLINER THROUGHOUT THE DAY IS IMPORTANT FOR DRINKING FLUIDS AND MOVING GAS THROUGHOUT THE GI TRACT.  COMPRESSION STOCKINGS SHOULD BE WORN [facility] STAY UNLESS YOU ARE WALKING.   INCENTIVE SPIROMETER SHOULD BE USED EVERY HOUR WHILE AWAKE TO DECREASE POST-OPERATIVE COMPLICATIONS SUCH AS PNEUMONIA.  WHEN DISCHARGED HOME, IT IS IMPORTANT TO CONTINUE TO WALK EVERY HOUR AND USE THE INCENTIVE SPIROMETER EVERY HOUR.           Take these medicines the morning of surgery with A SIP OF  WATER: Wellbutrin, Zoloft, Zyrtec                                 You may not have any metal on your body including hair pins and              piercings  Do not wear jewelry, make-up, lotions, powders or perfumes, deodorant             Do not wear nail polish on your fingernails.  Do not shave  48 hours prior to surgery.                Do not bring valuables to the hospital. [facility] IS NOT             RESPONSIBLE   FOR VALUABLES.  Contacts, dentures or bridgework may not be worn into surgery.        Name and phone number of your driver:  Special Instructions: N/A              Please read over the following fact sheets you were given: _____________________________________________________________________             [facility] - Preparing for Surgery Before surgery, you can  play an important role.   Because skin is not sterile, your skin needs to be as free of germs as possible.   You can reduce the number of germs on your skin by washing with CHG (chlorahexidine gluconate) soap before surgery.   CHG is an antiseptic cleaner which kills germs and bonds with the skin to continue killing germs even after washing. Please DO NOT use if you have an allergy to CHG or antibacterial soap s.  If your skin becomes reddened/irritated stop using the CHG and inform your nurse when you arrive at Short Stay. Do not shave (including legs and underarms) for at least 48 hours prior to the first CHG shower.    Please follow these instructions carefully:  1.  Shower with CHG Soap the night before surgery and the  morning of Surgery.  2.  If you choose to wash your hair, wash your hair first as usual with your  normal  shampoo.  3.  After you shampoo, rinse your hair and body thoroughly to remove the  shampoo.                                        4.  Use CHG as you would any other liquid soap.  You can apply chg directly  to the skin and wash                       Gently with a scrungie or  clean washcloth.  5.  Apply the CHG Soap to your body ONLY FROM THE NECK DOWN.   Do not use on face/ open                           Wound or open sores. Avoid contact with eyes, ears mouth and genitals (private parts).                       Wash face,  Genitals (private parts) with your normal soap.             6.  Wash thoroughly, paying special attention to the area where your surgery  will be performed.  7.  Thoroughly rinse your body with warm water from the neck down.  8.  DO NOT shower/wash with your normal soap after using and rinsing off  the CHG Soap.             9.  Pat yourself dry with a clean towel.            10.  Wear clean pajamas.            11.  Place clean sheets on your bed the night of your first shower and do not  sleep with pets. Day of Surgery : Do not apply any lotions/deodorants the morning of surgery.  Please wear clean clothes to the hospital/surgery center.  FAILURE TO FOLLOW THESE INSTRUCTIONS MAY RESULT IN THE CANCELLATION OF YOUR SURGERY PATIENT SIGNATURE_________________________________  NURSE SIGNATURE__________________________________  ________________________________________________________________________   Adam Phenix  An incentive spirometer is a tool that can help keep your lungs clear and active. This tool measures how well you are filling your lungs with each breath. Taking long deep breaths may help reverse or decrease the chance of developing breathing (pulmonary) problems (especially infection) following:  A long period of  time when you are unable to move or be active. BEFORE THE PROCEDURE   If the spirometer includes an indicator to show your best effort, your nurse or respiratory therapist will set it to a desired goal.  If possible, sit up straight or lean slightly forward. Try not to slouch.  Hold the incentive spirometer in an upright position. INSTRUCTIONS FOR USE  1. Sit on the edge of your bed if possible, or sit up as  far as you can in bed or on a chair. 2. Hold the incentive spirometer in an upright position. 3. Breathe out normally. 4. Place the mouthpiece in your mouth and seal your lips tightly around it. 5. Breathe in slowly and as deeply as possible, raising the piston or the ball toward the top of the column. 6. Hold your breath for 3-5 seconds or for as long as possible. Allow the piston or ball to fall to the bottom of the column. 7. Remove the mouthpiece from your mouth and breathe out normally. 8. Rest for a few seconds and repeat Steps 1 through 7 at least 10 times every 1-2 hours when you are awake. Take your time and take a few normal breaths between deep breaths. 9. The spirometer may include an indicator to show your best effort. Use the indicator as a goal to work toward during each repetition. 10. After each set of 10 deep breaths, practice coughing to be sure your lungs are clear. If you have an incision (the cut made at the time of surgery), support your incision when coughing by placing a pillow or rolled up towels firmly against it. Once you are able to get out of bed, walk around indoors and cough well. You may stop using the incentive spirometer when instructed by your caregiver.  RISKS AND COMPLICATIONS  Take your time so you do not get dizzy or light-headed.  If you are in pain, you may need to take or ask for pain medication before doing incentive spirometry. It is harder to take a deep breath if you are having pain. AFTER USE  Rest and breathe slowly and easily.  It can be helpful to keep track of a log of your progress. Your caregiver can provide you with a simple table to help with this. If you are using the spirometer at home, follow these instructions: SEEK MEDICAL CARE IF:   You are having difficultly using the spirometer.  You have trouble using the spirometer as often as instructed.  Your pain medication is not giving enough relief while using the spirometer.  You  develop fever of 100.5 F (38.1 C) or higher. SEEK IMMEDIATE MEDICAL CARE IF:   You cough up bloody sputum that had not been present before.  You develop fever of 102 F (38.9 C) or greater.  You develop worsening pain at or near the incision site. MAKE SURE YOU:   Understand these instructions.  Will watch your condition.  Will get help right away if you are not doing well or get worse. Document Released: 08/25/2006 Document Revised: 07/07/2011 Document Reviewed: 10/26/2006 [facility] Patient Information 2014 [place], [place].   ________________________________________________________________________

## 2019-08-26 ENCOUNTER — Encounter (HOSPITAL_COMMUNITY): Payer: Self-pay

## 2019-08-26 ENCOUNTER — Encounter (HOSPITAL_COMMUNITY)
Admission: RE | Admit: 2019-08-26 | Discharge: 2019-08-26 | Disposition: A | Discharge status: Home / Self Care | Payer: No Typology Code available for payment source | Source: Ambulatory Visit | Attending: General Surgery | Admitting: General Surgery

## 2019-08-26 ENCOUNTER — Inpatient Hospital Stay (HOSPITAL_COMMUNITY)
Admission: RE | Admit: 2019-08-26 | Discharge: 2019-08-26 | Disposition: A | Discharge status: Home / Self Care | Payer: No Typology Code available for payment source | Source: Ambulatory Visit

## 2019-08-26 ENCOUNTER — Other Ambulatory Visit: Payer: Self-pay

## 2019-08-26 ENCOUNTER — Other Ambulatory Visit (HOSPITAL_COMMUNITY)
Admission: RE | Admit: 2019-08-26 | Discharge: 2019-08-26 | Disposition: A | Discharge status: Home / Self Care | Payer: No Typology Code available for payment source | Source: Ambulatory Visit | Attending: General Surgery | Admitting: General Surgery

## 2019-08-26 DIAGNOSIS — Z01812 Encounter for preprocedural laboratory examination: Secondary | ICD-10-CM | POA: Insufficient documentation

## 2019-08-26 DIAGNOSIS — Z20822 Contact with and (suspected) exposure to covid-19: Secondary | ICD-10-CM | POA: Insufficient documentation

## 2019-08-26 LAB — COMPREHENSIVE METABOLIC PANEL
ALT: 16 U/L (ref 0–44)
AST: 18 U/L (ref 15–41)
Albumin: 4.2 g/dL (ref 3.5–5.0)
Alkaline Phosphatase: 66 U/L (ref 38–126)
Anion gap: 8 (ref 5–15)
BUN: 10 mg/dL (ref 6–20)
CO2: 23 mmol/L (ref 22–32)
Calcium: 8.4 mg/dL — ABNORMAL LOW (ref 8.9–10.3)
Chloride: 106 mmol/L (ref 98–111)
Creatinine, Ser: 0.78 mg/dL (ref 0.44–1.00)
GFR calc Af Amer: >60 mL/min (ref >60)
GFR calc non Af Amer: >60 mL/min (ref >60)
Glucose, Bld: 82 mg/dL (ref 70–99)
Potassium: 4.1 mmol/L (ref 3.5–5.1)
Sodium: 137 mmol/L (ref 135–145)
Total Bilirubin: 0.4 mg/dL (ref 0.3–1.2)
Total Protein: 7.6 g/dL (ref 6.5–8.1)

## 2019-08-26 LAB — CBC WITH DIFFERENTIAL/PLATELET
Abs Immature Granulocytes: 0.02 10*3/uL (ref 0.00–0.07)
Basophils Absolute: 0 10*3/uL (ref 0.0–0.1)
Basophils Relative: 1 %
Eosinophils Absolute: 0.2 10*3/uL (ref 0.0–0.5)
Eosinophils Relative: 3 %
HCT: 41.5 % (ref 36.0–46.0)
Hemoglobin: 13.1 g/dL (ref 12.0–15.0)
Immature Granulocytes: 0 %
Lymphocytes Relative: 21 %
Lymphs Abs: 1.4 10*3/uL (ref 0.7–4.0)
MCH: 29.8 pg (ref 26.0–34.0)
MCHC: 31.6 g/dL (ref 30.0–36.0)
MCV: 94.5 fL (ref 80.0–100.0)
Monocytes Absolute: 0.7 10*3/uL (ref 0.1–1.0)
Monocytes Relative: 10 %
Neutro Abs: 4.1 10*3/uL (ref 1.7–7.7)
Neutrophils Relative %: 65 %
Platelets: 154 10*3/uL (ref 150–400)
RBC: 4.39 MIL/uL (ref 3.87–5.11)
RDW: 12.9 % (ref 11.5–15.5)
WBC: 6.4 10*3/uL (ref 4.0–10.5)
nRBC: 0 % (ref 0.0–0.2)

## 2019-08-26 NOTE — Progress Notes (Signed)
PCP - R. Baity NP Cardiologist - none  Chest x-ray - no EKG - no Stress Test -no  ECHO - no Cardiac Cath - no  Sleep Study - no CPAP -   Fasting Blood Sugar - NA Checks Blood Sugar _____ times a day  Blood Thinner Instructions:NA Aspirin Instructions: Last Dose:  Anesthesia review:   Patient denies shortness of breath, fever, cough and chest pain at PAT appointment  yes Patient verbalized understanding of instructions that were given to them at the PAT appointment. Patient was also instructed that they will need to review over the PAT instructions again at home before surgery. yes

## 2019-08-27 LAB — SARS CORONAVIRUS 2 (TAT 6-24 HRS): SARS Coronavirus 2: NEGATIVE

## 2019-08-28 MED ORDER — BUPIVACAINE LIPOSOME 1.3 % IJ SUSP
20.0000 mL | Freq: Once | INTRAMUSCULAR | Status: DC
  Filled 2019-08-28: qty 20

## 2019-08-29 ENCOUNTER — Inpatient Hospital Stay (HOSPITAL_COMMUNITY): Payer: No Typology Code available for payment source | Admitting: Certified Registered Nurse Anesthetist

## 2019-08-29 ENCOUNTER — Other Ambulatory Visit: Payer: Self-pay

## 2019-08-29 ENCOUNTER — Encounter (HOSPITAL_COMMUNITY): Payer: Self-pay | Admitting: General Surgery

## 2019-08-29 ENCOUNTER — Inpatient Hospital Stay (HOSPITAL_COMMUNITY): Payer: No Typology Code available for payment source | Admitting: Physician Assistant

## 2019-08-29 ENCOUNTER — Encounter (HOSPITAL_COMMUNITY)
Admission: RE | Disposition: A | Discharge status: Home / Self Care | Payer: Self-pay | Source: Home / Self Care | Attending: General Surgery

## 2019-08-29 ENCOUNTER — Inpatient Hospital Stay (HOSPITAL_COMMUNITY)
Admission: RE | Admit: 2019-08-29 | Discharge: 2019-08-30 | DRG: 621 | Disposition: A | Discharge status: Home / Self Care | Payer: No Typology Code available for payment source | Attending: General Surgery | Admitting: General Surgery

## 2019-08-29 DIAGNOSIS — G8929 Other chronic pain: Secondary | ICD-10-CM | POA: Diagnosis present

## 2019-08-29 DIAGNOSIS — F419 Anxiety disorder, unspecified: Secondary | ICD-10-CM | POA: Diagnosis present

## 2019-08-29 DIAGNOSIS — Z820 Family history of epilepsy and other diseases of the nervous system: Secondary | ICD-10-CM | POA: Diagnosis not present

## 2019-08-29 DIAGNOSIS — Z8261 Family history of arthritis: Secondary | ICD-10-CM | POA: Diagnosis not present

## 2019-08-29 DIAGNOSIS — Z833 Family history of diabetes mellitus: Secondary | ICD-10-CM | POA: Diagnosis not present

## 2019-08-29 DIAGNOSIS — K219 Gastro-esophageal reflux disease without esophagitis: Secondary | ICD-10-CM | POA: Diagnosis present

## 2019-08-29 DIAGNOSIS — K449 Diaphragmatic hernia without obstruction or gangrene: Secondary | ICD-10-CM | POA: Diagnosis present

## 2019-08-29 DIAGNOSIS — Z79899 Other long term (current) drug therapy: Secondary | ICD-10-CM

## 2019-08-29 DIAGNOSIS — K760 Fatty (change of) liver, not elsewhere classified: Secondary | ICD-10-CM | POA: Diagnosis present

## 2019-08-29 DIAGNOSIS — Z20822 Contact with and (suspected) exposure to covid-19: Secondary | ICD-10-CM | POA: Diagnosis present

## 2019-08-29 DIAGNOSIS — Z8249 Family history of ischemic heart disease and other diseases of the circulatory system: Secondary | ICD-10-CM

## 2019-08-29 DIAGNOSIS — F329 Major depressive disorder, single episode, unspecified: Secondary | ICD-10-CM | POA: Diagnosis present

## 2019-08-29 DIAGNOSIS — E782 Mixed hyperlipidemia: Secondary | ICD-10-CM | POA: Diagnosis present

## 2019-08-29 DIAGNOSIS — M545 Low back pain: Secondary | ICD-10-CM | POA: Diagnosis present

## 2019-08-29 DIAGNOSIS — Z83438 Family history of other disorder of lipoprotein metabolism and other lipidemia: Secondary | ICD-10-CM

## 2019-08-29 DIAGNOSIS — Z791 Long term (current) use of non-steroidal anti-inflammatories (NSAID): Secondary | ICD-10-CM | POA: Diagnosis not present

## 2019-08-29 DIAGNOSIS — Z6837 Body mass index (BMI) 37.0-37.9, adult: Secondary | ICD-10-CM

## 2019-08-29 DIAGNOSIS — Z9884 Bariatric surgery status: Secondary | ICD-10-CM

## 2019-08-29 HISTORY — PX: LAPAROSCOPIC GASTRIC SLEEVE RESECTION: SHX5895

## 2019-08-29 LAB — TYPE AND SCREEN
ABO/RH(D): A POS
Antibody Screen: NEGATIVE

## 2019-08-29 LAB — HEMOGLOBIN AND HEMATOCRIT, BLOOD
HCT: 41.3 % (ref 36.0–46.0)
Hemoglobin: 13.2 g/dL (ref 12.0–15.0)

## 2019-08-29 LAB — PREGNANCY, URINE: Preg Test, Ur: NEGATIVE

## 2019-08-29 SURGERY — GASTRECTOMY, SLEEVE, LAPAROSCOPIC
Anesthesia: General | Site: Abdomen

## 2019-08-29 MED ORDER — DEXAMETHASONE SODIUM PHOSPHATE 10 MG/ML IJ SOLN
INTRAMUSCULAR | Status: DC | PRN
  Administered 2019-08-29: 10 mg via INTRAVENOUS

## 2019-08-29 MED ORDER — SIMETHICONE 80 MG PO CHEW
80.0000 mg | CHEWABLE_TABLET | Freq: Four times a day (QID) | ORAL | Status: DC | PRN
  Administered 2019-08-30: 80 mg via ORAL
  Filled 2019-08-29: qty 1

## 2019-08-29 MED ORDER — 0.9 % SODIUM CHLORIDE (POUR BTL) OPTIME
TOPICAL | Status: DC | PRN
  Administered 2019-08-29: 1000 mL

## 2019-08-29 MED ORDER — SERTRALINE HCL 50 MG PO TABS
200.0000 mg | ORAL_TABLET | Freq: Every day | ORAL | Status: DC
  Filled 2019-08-29: qty 8

## 2019-08-29 MED ORDER — EPHEDRINE SULFATE-NACL 50-0.9 MG/10ML-% IV SOSY
PREFILLED_SYRINGE | INTRAVENOUS | Status: DC | PRN
  Administered 2019-08-29: 10 mg via INTRAVENOUS

## 2019-08-29 MED ORDER — ONDANSETRON HCL 4 MG/2ML IJ SOLN: 4.0000 mg | Freq: Four times a day (QID) | INTRAMUSCULAR | Status: DC | PRN

## 2019-08-29 MED ORDER — MIDAZOLAM HCL 5 MG/5ML IJ SOLN
INTRAMUSCULAR | Status: DC | PRN
  Administered 2019-08-29: 2 mg via INTRAVENOUS

## 2019-08-29 MED ORDER — GABAPENTIN 100 MG PO CAPS
200.0000 mg | ORAL_CAPSULE | Freq: Two times a day (BID) | ORAL | Status: DC
  Administered 2019-08-29 – 2019-08-30 (×2): 200 mg via ORAL
  Filled 2019-08-29 (×2): qty 2

## 2019-08-29 MED ORDER — EPHEDRINE 5 MG/ML INJ
INTRAVENOUS | Status: AC
  Filled 2019-08-29: qty 10

## 2019-08-29 MED ORDER — LACTATED RINGERS IV SOLN: INTRAVENOUS | Status: DC

## 2019-08-29 MED ORDER — CHLORHEXIDINE GLUCONATE 4 % EX LIQD: 60.0000 mL | Freq: Once | CUTANEOUS | Status: DC

## 2019-08-29 MED ORDER — OXYCODONE HCL 5 MG/5ML PO SOLN
5.0000 mg | Freq: Four times a day (QID) | ORAL | Status: DC | PRN
  Administered 2019-08-29 – 2019-08-30 (×3): 5 mg via ORAL
  Filled 2019-08-29 (×3): qty 5

## 2019-08-29 MED ORDER — BUPROPION HCL ER (XL) 300 MG PO TB24
300.0000 mg | ORAL_TABLET | Freq: Every day | ORAL | Status: DC
  Administered 2019-08-30: 300 mg via ORAL
  Filled 2019-08-29: qty 1

## 2019-08-29 MED ORDER — GABAPENTIN 300 MG PO CAPS
300.0000 mg | ORAL_CAPSULE | ORAL | Status: AC
  Administered 2019-08-29: 300 mg via ORAL
  Filled 2019-08-29: qty 1

## 2019-08-29 MED ORDER — MORPHINE SULFATE (PF) 4 MG/ML IV SOLN: 1.0000 mg | INTRAVENOUS | Status: DC | PRN

## 2019-08-29 MED ORDER — ENSURE MAX PROTEIN PO LIQD
2.0000 [oz_av] | ORAL | Status: DC
  Administered 2019-08-30: 2 [oz_av] via ORAL

## 2019-08-29 MED ORDER — DEXAMETHASONE SODIUM PHOSPHATE 4 MG/ML IJ SOLN: 4.0000 mg | INTRAMUSCULAR | Status: DC

## 2019-08-29 MED ORDER — LACTATED RINGERS IV SOLN: INTRAVENOUS | Status: DC | PRN

## 2019-08-29 MED ORDER — OXYCODONE HCL 5 MG PO TABS: 5.0000 mg | ORAL_TABLET | Freq: Once | ORAL | Status: DC | PRN

## 2019-08-29 MED ORDER — FENTANYL CITRATE (PF) 250 MCG/5ML IJ SOLN
INTRAMUSCULAR | Status: AC
  Filled 2019-08-29: qty 5

## 2019-08-29 MED ORDER — PHENYLEPHRINE 40 MCG/ML (10ML) SYRINGE FOR IV PUSH (FOR BLOOD PRESSURE SUPPORT)
PREFILLED_SYRINGE | INTRAVENOUS | Status: AC
  Filled 2019-08-29: qty 10

## 2019-08-29 MED ORDER — SUGAMMADEX SODIUM 200 MG/2ML IV SOLN
INTRAVENOUS | Status: DC | PRN
  Administered 2019-08-29: 400 mg via INTRAVENOUS

## 2019-08-29 MED ORDER — KCL IN DEXTROSE-NACL 20-5-0.45 MEQ/L-%-% IV SOLN
INTRAVENOUS | Status: DC
  Filled 2019-08-29 (×2): qty 1000

## 2019-08-29 MED ORDER — SUGAMMADEX SODIUM 500 MG/5ML IV SOLN
INTRAVENOUS | Status: AC
  Filled 2019-08-29: qty 5

## 2019-08-29 MED ORDER — SODIUM CHLORIDE 0.9 % IV SOLN
2.0000 g | INTRAVENOUS | Status: AC
  Administered 2019-08-29: 2 g via INTRAVENOUS
  Filled 2019-08-29: qty 2

## 2019-08-29 MED ORDER — PANTOPRAZOLE SODIUM 40 MG IV SOLR
40.0000 mg | Freq: Every day | INTRAVENOUS | Status: DC
  Administered 2019-08-29: 40 mg via INTRAVENOUS
  Filled 2019-08-29: qty 40

## 2019-08-29 MED ORDER — OXYCODONE HCL 5 MG/5ML PO SOLN: 5.0000 mg | Freq: Once | ORAL | Status: DC | PRN

## 2019-08-29 MED ORDER — LIDOCAINE HCL 2 % IJ SOLN
INTRAMUSCULAR | Status: AC
  Filled 2019-08-29: qty 20

## 2019-08-29 MED ORDER — MIDAZOLAM HCL 2 MG/2ML IJ SOLN
INTRAMUSCULAR | Status: AC
  Filled 2019-08-29: qty 2

## 2019-08-29 MED ORDER — FENTANYL CITRATE (PF) 250 MCG/5ML IJ SOLN
INTRAMUSCULAR | Status: DC | PRN
  Administered 2019-08-29: 100 ug via INTRAVENOUS
  Administered 2019-08-29 (×3): 50 ug via INTRAVENOUS

## 2019-08-29 MED ORDER — DIPHENHYDRAMINE HCL 50 MG/ML IJ SOLN: 12.5000 mg | Freq: Three times a day (TID) | INTRAMUSCULAR | Status: DC | PRN

## 2019-08-29 MED ORDER — PHENYLEPHRINE 40 MCG/ML (10ML) SYRINGE FOR IV PUSH (FOR BLOOD PRESSURE SUPPORT)
PREFILLED_SYRINGE | INTRAVENOUS | Status: DC | PRN
  Administered 2019-08-29 (×5): 80 ug via INTRAVENOUS

## 2019-08-29 MED ORDER — SCOPOLAMINE 1 MG/3DAYS TD PT72
1.0000 | MEDICATED_PATCH | TRANSDERMAL | Status: DC
  Administered 2019-08-29: 1.5 mg via TRANSDERMAL
  Filled 2019-08-29: qty 1

## 2019-08-29 MED ORDER — ENOXAPARIN SODIUM 30 MG/0.3ML ~~LOC~~ SOLN
30.0000 mg | Freq: Two times a day (BID) | SUBCUTANEOUS | Status: DC
  Administered 2019-08-29 – 2019-08-30 (×2): 30 mg via SUBCUTANEOUS
  Filled 2019-08-29 (×2): qty 0.3

## 2019-08-29 MED ORDER — STERILE WATER FOR IRRIGATION IR SOLN
Status: DC | PRN
  Administered 2019-08-29: 1000 mL

## 2019-08-29 MED ORDER — ACETAMINOPHEN 500 MG PO TABS
1000.0000 mg | ORAL_TABLET | ORAL | Status: AC
  Administered 2019-08-29: 1000 mg via ORAL
  Filled 2019-08-29: qty 2

## 2019-08-29 MED ORDER — PROMETHAZINE HCL 25 MG/ML IJ SOLN: 12.5000 mg | Freq: Four times a day (QID) | INTRAMUSCULAR | Status: DC | PRN

## 2019-08-29 MED ORDER — ACETAMINOPHEN 160 MG/5ML PO SOLN
1000.0000 mg | Freq: Three times a day (TID) | ORAL | Status: DC
  Administered 2019-08-29 – 2019-08-30 (×2): 1000 mg via ORAL
  Filled 2019-08-29 (×2): qty 40.6

## 2019-08-29 MED ORDER — LIDOCAINE 2% (20 MG/ML) 5 ML SYRINGE
INTRAMUSCULAR | Status: DC | PRN
  Administered 2019-08-29: 100 mg via INTRAVENOUS

## 2019-08-29 MED ORDER — SODIUM CHLORIDE (PF) 0.9 % IJ SOLN
INTRAMUSCULAR | Status: AC
  Filled 2019-08-29: qty 50

## 2019-08-29 MED ORDER — BUPIVACAINE LIPOSOME 1.3 % IJ SUSP
INTRAMUSCULAR | Status: DC | PRN
  Administered 2019-08-29: 20 mL

## 2019-08-29 MED ORDER — FENTANYL CITRATE (PF) 100 MCG/2ML IJ SOLN
INTRAMUSCULAR | Status: AC
  Filled 2019-08-29: qty 2

## 2019-08-29 MED ORDER — ONDANSETRON HCL 4 MG/2ML IJ SOLN: 4.0000 mg | Freq: Once | INTRAMUSCULAR | Status: DC | PRN

## 2019-08-29 MED ORDER — ACETAMINOPHEN 500 MG PO TABS
1000.0000 mg | ORAL_TABLET | Freq: Three times a day (TID) | ORAL | Status: DC
  Filled 2019-08-29: qty 2

## 2019-08-29 MED ORDER — FENTANYL CITRATE (PF) 100 MCG/2ML IJ SOLN
25.0000 ug | INTRAMUSCULAR | Status: DC | PRN
  Administered 2019-08-29: 50 ug via INTRAVENOUS

## 2019-08-29 MED ORDER — PROPOFOL 10 MG/ML IV BOLUS
INTRAVENOUS | Status: DC | PRN
  Administered 2019-08-29: 200 mg via INTRAVENOUS

## 2019-08-29 MED ORDER — APREPITANT 40 MG PO CAPS
40.0000 mg | ORAL_CAPSULE | ORAL | Status: AC
  Administered 2019-08-29: 40 mg via ORAL
  Filled 2019-08-29: qty 1

## 2019-08-29 MED ORDER — HEPARIN SODIUM (PORCINE) 5000 UNIT/ML IJ SOLN
5000.0000 [IU] | INTRAMUSCULAR | Status: AC
  Administered 2019-08-29: 5000 [IU] via SUBCUTANEOUS
  Filled 2019-08-29: qty 1

## 2019-08-29 MED ORDER — SODIUM CHLORIDE (PF) 0.9 % IJ SOLN
INTRAMUSCULAR | Status: DC | PRN
  Administered 2019-08-29: 50 mL

## 2019-08-29 MED ORDER — ONDANSETRON HCL 4 MG/2ML IJ SOLN
INTRAMUSCULAR | Status: DC | PRN
  Administered 2019-08-29: 4 mg via INTRAVENOUS

## 2019-08-29 MED ORDER — LIDOCAINE 20MG/ML (2%) 15 ML SYRINGE OPTIME
INTRAMUSCULAR | Status: DC | PRN
  Administered 2019-08-29: 1 mg/kg/h via INTRAVENOUS

## 2019-08-29 MED ORDER — LACTATED RINGERS IR SOLN
Status: DC | PRN
  Administered 2019-08-29: 1000 mL

## 2019-08-29 MED ORDER — ROCURONIUM BROMIDE 10 MG/ML (PF) SYRINGE
PREFILLED_SYRINGE | INTRAVENOUS | Status: DC | PRN
  Administered 2019-08-29: 60 mg via INTRAVENOUS
  Administered 2019-08-29: 40 mg via INTRAVENOUS

## 2019-08-29 SURGICAL SUPPLY — 84 items
APPLICATOR COTTON TIP 6 STRL (MISCELLANEOUS) IMPLANT
APPLICATOR COTTON TIP 6IN STRL (MISCELLANEOUS)
APPLIER CLIP ROT 10 11.4 M/L (STAPLE)
APPLIER CLIP ROT 13.4 12 LRG (CLIP)
BENZOIN TINCTURE PRP APPL 2/3 (GAUZE/BANDAGES/DRESSINGS) ×3 IMPLANT
BLADE SURG SZ11 CARB STEEL (BLADE) ×3 IMPLANT
BNDG ADH 1X3 SHEER STRL LF (GAUZE/BANDAGES/DRESSINGS) ×18 IMPLANT
CABLE HIGH FREQUENCY MONO STRZ (ELECTRODE) ×1 IMPLANT
CHLORAPREP W/TINT 26 (MISCELLANEOUS) ×6 IMPLANT
CLIP APPLIE ROT 10 11.4 M/L (STAPLE) IMPLANT
CLIP APPLIE ROT 13.4 12 LRG (CLIP) IMPLANT
CLOSURE WOUND 1/2 X4 (GAUZE/BANDAGES/DRESSINGS) ×1
COVER SURGICAL LIGHT HANDLE (MISCELLANEOUS) ×3 IMPLANT
COVER WAND RF STERILE (DRAPES) IMPLANT
DECANTER SPIKE VIAL GLASS SM (MISCELLANEOUS) ×3 IMPLANT
DEVICE SUT QUICK LOAD TK 5 (STAPLE) ×2 IMPLANT
DEVICE SUT TI-KNOT TK 5X26 (MISCELLANEOUS) ×1 IMPLANT
DEVICE SUTURE ENDOST 10MM (ENDOMECHANICALS) ×2 IMPLANT
DEVICE TI KNOT TK5 (MISCELLANEOUS) ×1
DISSECTOR BLUNT TIP ENDO 5MM (MISCELLANEOUS) IMPLANT
DRAPE UTILITY XL STRL (DRAPES) ×6 IMPLANT
ELECT L-HOOK LAP 45CM DISP (ELECTROSURGICAL)
ELECT REM PT RETURN 15FT ADLT (MISCELLANEOUS) ×3 IMPLANT
ELECTRODE L-HOOK LAP 45CM DISP (ELECTROSURGICAL) IMPLANT
GAUZE SPONGE 2X2 8PLY STRL LF (GAUZE/BANDAGES/DRESSINGS) IMPLANT
GAUZE SPONGE 4X4 12PLY STRL (GAUZE/BANDAGES/DRESSINGS) IMPLANT
GLOVE BIO SURGEON STRL SZ7.5 (GLOVE) ×3 IMPLANT
GLOVE BIOGEL PI IND STRL 7.0 (GLOVE) IMPLANT
GLOVE BIOGEL PI IND STRL 7.5 (GLOVE) IMPLANT
GLOVE BIOGEL PI INDICATOR 7.0 (GLOVE) ×2
GLOVE BIOGEL PI INDICATOR 7.5 (GLOVE) ×2
GLOVE ECLIPSE 6.5 STRL STRAW (GLOVE) ×2 IMPLANT
GLOVE INDICATOR 8.0 STRL GRN (GLOVE) ×3 IMPLANT
GLOVE SURG SS PI 7.0 STRL IVOR (GLOVE) ×4 IMPLANT
GOWN STRL REUS W/TWL LRG LVL3 (GOWN DISPOSABLE) ×2 IMPLANT
GOWN STRL REUS W/TWL XL LVL3 (GOWN DISPOSABLE) ×9 IMPLANT
GRASPER SUT TROCAR 14GX15 (MISCELLANEOUS) ×3 IMPLANT
HOVERMATT SINGLE USE (MISCELLANEOUS) ×3 IMPLANT
KIT BASIN (CUSTOM PROCEDURE TRAY) ×3 IMPLANT
KIT TURNOVER KIT A (KITS) IMPLANT
MARKER SKIN DUAL TIP RULER LAB (MISCELLANEOUS) ×3 IMPLANT
NDL SPNL 22GX3.5 QUINCKE BK (NEEDLE) ×1 IMPLANT
NEEDLE SPNL 22GX3.5 QUINCKE BK (NEEDLE) ×3 IMPLANT
PACK UNIVERSAL I (CUSTOM PROCEDURE TRAY) ×3 IMPLANT
PENCIL SMOKE EVACUATOR (MISCELLANEOUS) IMPLANT
QUICK LOAD TK 5 (STAPLE) ×2
RELOAD STAPLE 60 3.6 BLU REG (STAPLE) ×1 IMPLANT
RELOAD STAPLE 60 3.8 GOLD REG (STAPLE) IMPLANT
RELOAD STAPLE 60 4.1 GRN THCK (STAPLE) ×1 IMPLANT
RELOAD STAPLE 60 BLK VRY/THCK (STAPLE) IMPLANT
RELOAD STAPLER 60MM BLK (STAPLE) IMPLANT
RELOAD STAPLER BLUE 60MM (STAPLE) ×5 IMPLANT
RELOAD STAPLER GOLD 60MM (STAPLE) IMPLANT
RELOAD STAPLER GREEN 60MM (STAPLE) ×1 IMPLANT
SCISSORS LAP 5X45 EPIX DISP (ENDOMECHANICALS) IMPLANT
SEALANT SURGICAL APPL DUAL CAN (MISCELLANEOUS) IMPLANT
SET IRRIG TUBING LAPAROSCOPIC (IRRIGATION / IRRIGATOR) ×3 IMPLANT
SET TUBE SMOKE EVAC HIGH FLOW (TUBING) ×3 IMPLANT
SHEARS HARMONIC ACE PLUS 45CM (MISCELLANEOUS) ×3 IMPLANT
SLEEVE GASTRECTOMY 40FR VISIGI (MISCELLANEOUS) ×3 IMPLANT
SLEEVE XCEL OPT CAN 5 100 (ENDOMECHANICALS) ×9 IMPLANT
SOL ANTI FOG 6CC (MISCELLANEOUS) ×1 IMPLANT
SOLUTION ANTI FOG 6CC (MISCELLANEOUS) ×2
SPONGE GAUZE 2X2 STER 10/PKG (GAUZE/BANDAGES/DRESSINGS)
SPONGE LAP 18X18 RF (DISPOSABLE) ×3 IMPLANT
STAPLER ECHELON BIOABSB 60 FLE (MISCELLANEOUS) ×14 IMPLANT
STAPLER ECHELON LONG 60 440 (INSTRUMENTS) ×3 IMPLANT
STAPLER RELOAD 60MM BLK (STAPLE)
STAPLER RELOAD BLUE 60MM (STAPLE) ×15
STAPLER RELOAD GOLD 60MM (STAPLE)
STAPLER RELOAD GREEN 60MM (STAPLE) ×3
STRIP CLOSURE SKIN 1/2X4 (GAUZE/BANDAGES/DRESSINGS) ×2 IMPLANT
SUT MNCRL AB 4-0 PS2 18 (SUTURE) ×3 IMPLANT
SUT SURGIDAC NAB ES-9 0 48 120 (SUTURE) ×4 IMPLANT
SUT VICRYL 0 TIES 12 18 (SUTURE) ×3 IMPLANT
SYR 20ML LL LF (SYRINGE) ×3 IMPLANT
TOWEL OR 17X26 10 PK STRL BLUE (TOWEL DISPOSABLE) ×3 IMPLANT
TOWEL OR NON WOVEN STRL DISP B (DISPOSABLE) ×3 IMPLANT
TROCAR BLADELESS 15MM (ENDOMECHANICALS) ×3 IMPLANT
TROCAR BLADELESS OPT 5 100 (ENDOMECHANICALS) ×3 IMPLANT
TUBE CALIBRATION LAPBAND (TUBING) ×2 IMPLANT
TUBING CONNECTING 10 (TUBING) ×3 IMPLANT
TUBING CONNECTING 10' (TUBING) ×1
TUBING ENDO SMARTCAP (MISCELLANEOUS) ×3 IMPLANT

## 2019-08-29 NOTE — Progress Notes (Signed)
Discussed post op day goals with patient including ambulation, IS, diet progression, pain, and nausea control.  BSTOP education provided including BSTOP information guide, "Guide for Pain Management after your Bariatric Procedure".  Questions answered. 

## 2019-08-29 NOTE — Anesthesia Preprocedure Evaluation (Signed)
Anesthesia Evaluation  Patient identified by MRN, date of birth, ID band Patient awake    Reviewed: Allergy & Precautions, NPO status , Patient's Chart, lab work & pertinent test results  History of Anesthesia Complications Negative for: history of anesthetic complications  Airway Mallampati: II  TM Distance: >3 FB Neck ROM: Full    Dental  (+) Teeth Intact   Pulmonary neg pulmonary ROS,    Pulmonary exam normal        Cardiovascular negative cardio ROS Normal cardiovascular exam     Neuro/Psych PSYCHIATRIC DISORDERS Anxiety Depression negative neurological ROS     GI/Hepatic Neg liver ROS, GERD  ,  Endo/Other  negative endocrine ROS  Renal/GU negative Renal ROS  negative genitourinary   Musculoskeletal negative musculoskeletal ROS (+)   Abdominal   Peds  Hematology negative hematology ROS (+)   Anesthesia Other Findings   Reproductive/Obstetrics                             Anesthesia Physical Anesthesia Plan  ASA: II  Anesthesia Plan: General   Post-op Pain Management:    Induction: Intravenous  PONV Risk Score and Plan: 3 and Ondansetron, Dexamethasone, Treatment may vary due to age or medical condition and Midazolam  Airway Management Planned: Oral ETT  Additional Equipment: None  Intra-op Plan:   Post-operative Plan: Extubation in OR  Informed Consent: I have reviewed the patients History and Physical, chart, labs and discussed the procedure including the risks, benefits and alternatives for the proposed anesthesia with the patient or authorized representative who has indicated his/her understanding and acceptance.     Dental advisory given  Plan Discussed with:   Anesthesia Plan Comments:        Anesthesia Quick Evaluation

## 2019-08-29 NOTE — Anesthesia Postprocedure Evaluation (Signed)
Anesthesia Post Note  Patient: Robin Little  Procedure(s) Performed: LAPAROSCOPIC GASTRIC SLEEVE RESECTION, HIATAL HERNIA REPAIRUpper Endo, ERAS Pathway (N/A Abdomen)     Patient location during evaluation: PACU Anesthesia Type: General Level of consciousness: awake and alert Pain management: pain level controlled Vital Signs Assessment: post-procedure vital signs reviewed and stable Respiratory status: spontaneous breathing, nonlabored ventilation and respiratory function stable Cardiovascular status: blood pressure returned to baseline and stable Postop Assessment: no apparent nausea or vomiting Anesthetic complications: no    Last Vitals:  Vitals:   08/29/19 1545 08/29/19 1601  BP:  (!) 141/94  Pulse:  75  Resp:  18  Temp: 36.7 C 37 C  SpO2:  99%    Last Pain:  Vitals:   08/29/19 1601  TempSrc: Oral  PainSc: 4                  Lucretia Kern

## 2019-08-29 NOTE — Progress Notes (Signed)
PHARMACY CONSULT FOR:  Risk Assessment for Post-Discharge VTE Following Bariatric Surgery  Post-Discharge VTE Risk Assessment: This patient's probability of 30-day post-discharge VTE is increased due to the factors marked:   Female    Age >/=60 years    BMI >/=50 kg/m2    CHF    Dyspnea at Rest    Paraplegia   X Non-gastric-band surgery    Operation Time >/=3 hr    Return to OR     Length of Stay >/= 3 d      Hx of VTE   Hypercoagulable condition   Significant venous stasis   Predicted probability of 30-day post-discharge VTE: 0.16%  Other patient-specific factors to consider: none   Recommendation for Discharge: No pharmacologic prophylaxis post-discharge   Robin Little is a 34 y.o. female who underwent  laparoscopic sleeve gastrectomy on 08/29/19   Case start: 1319 Case end: 1423   No Known Allergies  Patient Measurements: Height: 5\' 5"  (165.1 cm) Weight: 100.5 kg (221 lb 9.6 oz) IBW/kg (Calculated) : 57 Body mass index is 36.88 kg/m.  Recent Labs    08/26/19 1458  WBC 6.4  HGB 13.1  HCT 41.5  PLT 154  CREATININE 0.78  ALBUMIN 4.2  PROT 7.6  AST 18  ALT 16  ALKPHOS 66  BILITOT 0.4   Estimated Creatinine Clearance: 116.4 mL/min (by C-G formula based on SCr of 0.78 mg/dL).    Past Medical History:  Diagnosis Date  . Anemia    thrombocytopenia was when pregnant  . Anxiety   . Depression   . GERD (gastroesophageal reflux disease)   . Headache    migraines  . History of shingles   . Hyperlipidemia   . IBS (irritable bowel syndrome)   . Obesity   . Pneumonia    as a kid     Medications Prior to Admission  Medication Sig Dispense Refill Last Dose  . buPROPion (WELLBUTRIN XL) 300 MG 24 hr tablet TAKE 1 TABLET (300 MG TOTAL) BY MOUTH DAILY. 90 tablet 1 08/29/2019 at 0645  . cetirizine (ZYRTEC) 10 MG tablet Take 10 mg by mouth daily.   08/29/2019 at 0645  . ibuprofen (ADVIL) 200 MG tablet Take 400-600 mg by mouth every 8 (eight) hours as needed  (pain.).    Past Week at Unknown time  . sertraline (ZOLOFT) 100 MG tablet TAKE 2 TABLETS (200 MG TOTAL) BY MOUTH DAILY. 180 tablet 1 08/29/2019 at 0645  . triamcinolone (NASACORT ALLERGY 24HR) 55 MCG/ACT AERO nasal inhaler Place 1 spray into the nose daily as needed (allergies.).   Past Week at Unknown time  . acetaminophen (TYLENOL) 500 MG tablet Take 500-1,000 mg by mouth every 6 (six) hours as needed for moderate pain.    More than a month at Unknown time  . butalbital-acetaminophen-caffeine (FIORICET) 50-325-40 MG tablet Take 1-2 tablets by mouth every 6 (six) hours as needed for headache. 20 tablet 0 More than a month at Unknown time       10/29/2019 PharmD 08/29/2019,2:33 PM

## 2019-08-29 NOTE — Discharge Instructions (Signed)
   GASTRIC BYPASS/SLEEVE  Home Care Instructions   These instructions are to help you care for yourself when you go home.  Call: If you have any problems. . Call [phone] and ask for the surgeon on call . If you need immediate help, come to the ER at [facility].  . Tell the ER staff that you are a new post-op gastric bypass or gastric sleeve patient   Signs and symptoms to report: . Severe vomiting or nausea o If you cannot keep down clear liquids for longer than 1 day, call your surgeon  . Abdominal pain that does not get better after taking your pain medication . Fever over 100.4 F with chills . Heart beating over 100 beats a minute . Shortness of breath at rest . Chest pain .  Redness, swelling, drainage, or foul odor at incision (surgical) sites .  If your incisions open or pull apart . Swelling or pain in calf (lower leg) . Diarrhea (Loose bowel movements that happen often), frequent watery, uncontrolled bowel movements . Constipation, (no bowel movements for 3 days) if this happens: Pick one o Milk of Magnesia, 2 tablespoons by mouth, 3 times a day for 2 days if needed o Stop taking Milk of Magnesia once you have a bowel movement o Call your doctor if constipation continues Or o Miralax  (instead of Milk of Magnesia) following the label instructions o Stop taking Miralax once you have a bowel movement o Call your doctor if constipation continues . Anything you think is not normal   Normal side effects after surgery: . Unable to sleep at night or unable to focus . Irritability or moody . Being tearful (crying) or depressed These are common complaints, possibly related to your anesthesia medications that put you to sleep, stress of surgery, and change in lifestyle.  This usually goes away a few weeks after surgery.  If these feelings continue, call your primary care doctor.   Wound Care: You may have surgical glue, steri-strips, or staples over your incisions after  surgery . Surgical glue:  Looks like a clear film over your incisions and will wear off a little at a time . Steri-strips: Strips of tape over your incisions. You may notice a yellowish color on the skin under the steri-strips. This is used to make the   steri-strips stick better. Do not pull the steri-strips off - let them fall off . Staples: Staples may be removed before you leave the hospital o If you go home with staples, call [facility], [phone] at for an appointment with your surgeon's nurse to have staples removed 10 days after surgery. . Showering: You may shower two (2) days after your surgery unless your surgeon tells you differently o Wash gently around incisions with warm soapy water, rinse well, and gently pat dry  o No tub baths until staples are removed, steri-strips fall off or glue is gone.    Medications: . Medications should be liquid or crushed if larger than the size of a dime . Extended release pills (medication that release a little bit at a time through the day) should NOT be crushed or cut. (examples include XL, ER, DR, SR) . Depending on the size and number of medications you take, you may need to space (take a few throughout the day)/change the time you take your medications so that you do not over-fill your pouch (smaller stomach) . Make sure you follow-up with your primary care doctor to   make medication changes needed during rapid weight loss and life-style changes . If you have diabetes, follow up with the doctor that orders your diabetes medication(s) within one week after surgery and check your blood sugar regularly. . Do not drive while taking prescription pain medication  . It is ok to take Tylenol by the bottle instructions with your pain medicine or instead of your pain medicine as needed.  DO NOT TAKE NSAIDS (EXAMPLES OF NSAIDS:  IBUPROFREN/ NAPROXEN)  Diet:                    First 2 Weeks  You will see the dietician t about two (2) weeks  after your surgery. The dietician will increase the types of foods you can eat if you are handling liquids well: . If you have severe vomiting or nausea and cannot keep down clear liquids lasting longer than 1 day, call your surgeon @ ([phone]) Protein Shake . Drink at least 2 ounces of shake 5-6 times per day . Each serving of protein shakes (usually 8 - 12 ounces) should have: o 15 grams of protein  o And no more than 5 grams of carbohydrate  . Goal for protein each day: o Men = 80 grams per day o Women = 60 grams per day . Protein powder may be added to fluids such as non-fat milk or Lactaid milk or unsweetened Soy/Almond milk (limit to 35 grams added protein powder per serving)  Hydration . Slowly increase the amount of water and other clear liquids as tolerated (See Acceptable Fluids) . Slowly increase the amount of protein shake as tolerated  .  Sip fluids slowly and throughout the day.  Do not use straws. . May use sugar substitutes in small amounts (no more than 6 - 8 packets per day; i.e. Splenda)  Fluid Goal . The first goal is to drink at least 8 ounces of protein shake/drink per day (or as directed by the nutritionist); some examples of protein shakes are Syntrax Nectar, Adkins Advantage, EAS Edge HP, and Unjury. See handout from pre-op Bariatric Education Class: o Slowly increase the amount of protein shake you drink as tolerated o You may find it easier to slowly sip shakes throughout the day o It is important to get your proteins in first . Your fluid goal is to drink 64 - 100 ounces of fluid daily o It may take a few weeks to build up to this . 32 oz (or more) should be clear liquids  And  . 32 oz (or more) should be full liquids (see below for examples) . Liquids should not contain sugar, caffeine, or carbonation  Clear Liquids: . Water or Sugar-free flavored water (i.e. Fruit H2O, Propel) . Decaffeinated coffee or tea (sugar-free) . Crystal Lite, Wyler's Lite,  Minute Maid Lite . Sugar-free Jell-O . Bouillon or broth . Sugar-free Popsicle:   *Less than 20 calories each; Limit 1 per day  Full Liquids: Protein Shakes/Drinks + 2 choices per day of other full liquids . Full liquids must be: o No More Than 15 grams of Carbs per serving  o No More Than 3 grams of Fat per serving . Strained low-fat cream soup (except Cream of Potato or Tomato) . Non-Fat milk . Fat-free Lactaid Milk . Unsweetened Soy Or Unsweetened Almond Milk . Low Sugar yogurt (Dannon Lite & Fit, Greek yogurt; Oikos Triple Zero; Chobani Simply 100; Yoplait 100 calorie Greek - No Fruit on the Bottom)    Vitamins   and Minerals . Start 1 day after surgery unless otherwise directed by your surgeon . Chewable Bariatric Specific Multivitamin / Multimineral Supplement with iron (Example: Bariatric Advantage Multi EA) . Chewable Calcium with Vitamin D-3 (Example: 3 Chewable Calcium Plus 600 with Vitamin D-3) o Take 500 mg three (3) times a day for a total of 1500 mg each day o Do not take all 3 doses of calcium at one time as it may cause constipation, and you can only absorb 500 mg  at a time  o Do not mix multivitamins containing iron with calcium supplements; take 2 hours apart . Menstruating women and those with a history of anemia (a blood disease that causes weakness) may need extra iron o Talk with your doctor to see if you need more iron . Do not stop taking or change any vitamins or minerals until you talk to your dietitian or surgeon . Your Dietitian and/or surgeon must approve all vitamin and mineral supplements   Activity and Exercise: Limit your physical activity as instructed by your doctor.  It is important to continue walking at home.  During this time, use these guidelines: . Do not lift anything greater than ten (10) pounds for at least two (2) weeks . Do not go back to work or drive until your surgeon says you can . You may have sex when you feel comfortable  o It is  VERY important for female patients to use a reliable birth control method; fertility often increases after surgery  o All hormonal birth control will be ineffective for 30 days after surgery due to medications given during surgery a barrier method must be used. o Do not get pregnant for at least 18 months . Start exercising as soon as your doctor tells you that you can o Make sure your doctor approves any physical activity . Start with a simple walking program . Walk 5-15 minutes each day, 7 days per week.  . Slowly increase until you are walking 30-45 minutes per day Consider joining our BELT program. [phone] or email [email or web address]   Special Instructions Things to remember: . Use your CPAP when sleeping if this applies to you  . [facility] has two free Bariatric Surgery Support Groups that meet monthly o The 3rd Thursday of each month, 6 pm, [facility]  o The 2nd Friday of each month, 11:45 am in the private dining room in the basement of [facility] . It is very important to keep all follow up appointments with your surgeon, dietitian, primary care physician, and behavioral health practitioner . Routine follow up schedule with your surgeon include appointments at 2-3 weeks, 6-8 weeks, 6 months, and 1 year at a minimum.  Your surgeon may request to see you more often.   o After the first year, please follow up with your bariatric surgeon and dietitian at least once a year in order to maintain best weight loss results [facility]: [phone] [facility]: [phone] Bariatric Nurse Coordinator: [phone]      Reviewed and Endorsed  by [facility] Patient Education Committee, June, 2016 Edits Approved: Aug, 2018    

## 2019-08-29 NOTE — H&P (Signed)
Robin Little is an 33 y.o. female.   Chief Complaint: here for surgery HPI: 33-year-old female with obesity presents for laparoscopic sleeve gastrectomy.  She denies any medical changes since she was last seen.  No chest pain, chest pressure, shortness of breath, dyspnea exertion.  No trips to the emergency room hospital.  The patient is a 33 year old female who presents for a bariatric surgery evaluation. She comes in today for long-term follow-up regarding her obesity, hyperlipidemia, hypercholesterolemia and fatty liver. I initially met her in September to discuss weight loss surgery. She has completed her bariatric surgery pathway. She has received psychological clearance. She denies any medical changes since I saw her in September. She denies any trips the emergency room hospital. She denies any chest pain, chest pressure, shortness of breath, orthopnea, dyspnea on exertion, abdominal issues. She denies any vision changes.  12/30/2018  She is referred by Regina Baity NP for evaluation of weight loss surgery. She completed our seminar on line. She is interested in sleeve gastrectomy. He is interested in improving her overall health in order to be more active. Despite numerous attempts for sustained weight loss she is been unsuccessful. She has tried Weight Watchers on several occasions, Atkins, and ketogenic diet all without any long-term success.  Her comorbidities include hypercholesterolemia, hyperlipidemia, fatty liver  She denies any chest pain, chest pressure, source of breath, orthopnea, dyspnea on exertion. She denies any peripheral edema, blood clots. Her sleep apnea questionnaire was negative. She has very rare reflux in it is only related to certain foods. She denies any abdominal pain, diarrhea, melena or hematochezia. She does have infrequent stools and averages a bowel movement every other day. She denies any dysuria or hematuria. Her periods are regular. Her  husband has had a vasectomy. She has some chronic low back pain without sciatica. She denies any diagnosis of diabetes. She may have a migraine around her cycle. She denies any tobacco or drug use. She drinks alcohol on a social occasion.  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  She had an annual physical about a month and a half ago. CBC was unremarkable except for a platelet count of around 140,000 which is stable for her. Hemoglobin A1c was 5.2. Total cholesterol 208. Triglyceride 149, LDL 130. Comprehensive metabolic panel was within normal limits. TSH within normal limits. She has had prior abdominal imaging which showed hepatic steatosis  Her mother and sister had diabetes mellitus and hypertension  Past Medical History:  Diagnosis Date  . Anemia    thrombocytopenia was when pregnant  . Anxiety   . Depression   . GERD (gastroesophageal reflux disease)   . Headache    migraines  . History of shingles   . Hyperlipidemia   . IBS (irritable bowel syndrome)   . Obesity   . Pneumonia    as a kid    Past Surgical History:  Procedure Laterality Date  . NO PAST SURGERIES      Family History  Problem Relation Age of Onset  . Hyperlipidemia Mother   . Hypertension Mother   . Arthritis Mother   . Hypothyroidism Mother   . Hyperlipidemia Father   . Hypertension Father   . Cancer Maternal Grandmother        melanoma  . Goiter Maternal Grandmother   . Cancer Paternal Grandmother        liver  . Diabetes Maternal Grandfather   . Parkinson's disease Maternal Grandfather   . Hyperlipidemia Maternal Grandfather   .   Hypertension Maternal Grandfather   . Alcohol abuse Paternal Grandfather    Social History:  reports that she has never smoked. She has never used smokeless tobacco. She reports current alcohol use. She reports that she does not use drugs.  Allergies: No Known Allergies  Medications Prior to Admission  Medication Sig Dispense Refill  .  buPROPion (WELLBUTRIN XL) 300 MG 24 hr tablet TAKE 1 TABLET (300 MG TOTAL) BY MOUTH DAILY. 90 tablet 1  . cetirizine (ZYRTEC) 10 MG tablet Take 10 mg by mouth daily.    Marland Kitchen ibuprofen (ADVIL) 200 MG tablet Take 400-600 mg by mouth every 8 (eight) hours as needed (pain.).     Marland Kitchen sertraline (ZOLOFT) 100 MG tablet TAKE 2 TABLETS (200 MG TOTAL) BY MOUTH DAILY. 180 tablet 1  . triamcinolone (NASACORT ALLERGY 24HR) 55 MCG/ACT AERO nasal inhaler Place 1 spray into the nose daily as needed (allergies.).    Marland Kitchen acetaminophen (TYLENOL) 500 MG tablet Take 500-1,000 mg by mouth every 6 (six) hours as needed for moderate pain.     . butalbital-acetaminophen-caffeine (FIORICET) 50-325-40 MG tablet Take 1-2 tablets by mouth every 6 (six) hours as needed for headache. 20 tablet 0    Results for orders placed or performed during the hospital encounter of 08/29/19 (from the past 48 hour(s))  Pregnancy, urine STAT morning of surgery     Status: None   Collection Time: 08/29/19 10:05 AM  Result Value Ref Range   Preg Test, Ur NEGATIVE NEGATIVE    Comment:        THE SENSITIVITY OF THIS METHODOLOGY IS >20 mIU/mL. Performed at [facility], [place] [address] [place], [place] [address]    No results found.  Review of Systems  All other systems reviewed and are negative.   Blood pressure 125/88, pulse 75, temperature 98.6 F (37 C), temperature source Oral, resp. rate 18, height 5' 5" (1.651 m), weight 100.5 kg, last menstrual period 08/02/2019, SpO2 100 %. Physical Exam  Vitals reviewed. Constitutional: She is oriented to person, place, and time. She appears well-developed and well-nourished. No distress.  HENT:  Head: Normocephalic and atraumatic.  Right Ear: External ear normal.  Left Ear: External ear normal.  Eyes: Conjunctivae are normal. No scleral icterus.  Neck: No tracheal deviation present. No thyromegaly present.  Cardiovascular: Normal rate and normal heart sounds.  Respiratory:  Effort normal and breath sounds normal. No stridor. No respiratory distress. She has no wheezes.  GI: Soft. She exhibits no distension. There is no abdominal tenderness. There is no rebound.  Musculoskeletal:        General: No tenderness or edema.     Cervical back: Normal range of motion and neck supple.  Lymphadenopathy:    She has no cervical adenopathy.  Neurological: She is alert and oriented to person, place, and time. She exhibits normal muscle tone.  Skin: Skin is warm and dry. No rash noted. She is not diaphoretic. No erythema. No pallor.  Psychiatric: She has a normal mood and affect. Her behavior is normal. Judgment and thought content normal.     Assessment/Plan Obesity Fatty liver  Mixed hyperlipidemia  To OR for lap sleeve gastrectomy, upper endo All questions asked and answered IV antibiotic Eras protocol  Leighton Ruff. Redmond Pulling, MD, [place], Bariatric, & Minimally Invasive Surgery [facility], [place]   Greer Pickerel, MD 08/29/2019, 12:33 PM

## 2019-08-29 NOTE — Transfer of Care (Signed)
Immediate Anesthesia Transfer of Care Note  Patient: Robin Little  Procedure(s) Performed: LAPAROSCOPIC GASTRIC SLEEVE RESECTION, HIATAL HERNIA REPAIRUpper Endo, ERAS Pathway (N/A Abdomen)  Patient Location: PACU  Anesthesia Type:General  Level of Consciousness: awake, alert , oriented and patient cooperative  Airway & Oxygen Therapy: Patient Spontanous Breathing and Patient connected to face mask oxygen  Post-op Assessment: Report given to RN, Post -op Vital signs reviewed and stable and Patient moving all extremities  Post vital signs: Reviewed and stable  Last Vitals:  Vitals Value Taken Time  BP 123/95 08/29/19 1432  Temp    Pulse 87 08/29/19 1437  Resp 19 08/29/19 1437  SpO2 100 % 08/29/19 1437  Vitals shown include unvalidated device data.  Last Pain:  Vitals:   08/29/19 1031  TempSrc:   PainSc: 0-No pain         Complications: No apparent anesthesia complications

## 2019-08-29 NOTE — Anesthesia Procedure Notes (Signed)
Procedure Name: Intubation Date/Time: 08/29/2019 12:58 PM Performed by: Mitzie Na, CRNA Pre-anesthesia Checklist: Patient identified, Emergency Drugs available, Suction available and Patient being monitored Patient Re-evaluated:Patient Re-evaluated prior to induction Oxygen Delivery Method: Circle system utilized Preoxygenation: Pre-oxygenation with 100% oxygen Induction Type: IV induction Ventilation: Mask ventilation without difficulty Laryngoscope Size: Mac and 3 Grade View: Grade I Tube type: Oral Tube size: 7.5 mm Number of attempts: 1 Airway Equipment and Method: Stylet Placement Confirmation: ETT inserted through vocal cords under direct vision,  positive ETCO2 and breath sounds checked- equal and bilateral Secured at: 23 cm Tube secured with: Tape Dental Injury: Teeth and Oropharynx as per pre-operative assessment

## 2019-08-29 NOTE — Op Note (Signed)
Preoperative diagnosis: laparoscopic sleeve gastrectomy  Postoperative diagnosis: Same   Procedure: Upper endoscopy   Surgeon: Amilah Greenspan, M.D.  Anesthesia: Gen.   Indications for procedure: This patient was undergoing a laparoscopic sleeve gastrectomy.   Description of procedure: The endoscopy was placed in the mouth and into the oropharynx and under endoscopic vision it was advanced to the esophagogastric junction. The pouch was insufflated and no bleeding or bubbles were seen. The GEJ was identified at 34cm from the teeth.  No bleeding or leaks were detected. The scope was withdrawn without difficulty.   Jarone Ostergaard, M.D. General, Bariatric, & Minimally Invasive Surgery [facility]    

## 2019-08-29 NOTE — Op Note (Signed)
08/29/2019 Robin Little 18-May-1985 [id number]   PRE-OPERATIVE DIAGNOSIS:   Obesity BMI 37 Hypercholesterolemia Hyperlipidemia Fatty liver  POST-OPERATIVE DIAGNOSIS:  Same + small sliding hiatal hernia  PROCEDURE:  Procedure(s): LAPAROSCOPIC SLEEVE GASTRECTOMY WITH HIATAL HERNIA REPAIR UPPER GI ENDOSCOPY  SURGEON:  Surgeon(s): Atilano Ina, MD FACS FASMBS  ASSISTANTS: Feliciana Rossetti MD FACS  ANESTHESIA:   general  DRAINS: none   BOUGIE: 40 fr ViSiGi  LOCAL MEDICATIONS USED:   Exparel  EBL: minimal  SPECIMEN:  Source of Specimen:  Greater curvature of stomach  DISPOSITION OF SPECIMEN:  PATHOLOGY  COUNTS:  YES  INDICATION FOR PROCEDURE: This is a very pleasant 34 y.o.-year-old morbidly obese female who has had unsuccessful attempts for sustained weight loss. The patient presents today for a planned laparoscopic sleeve gastrectomy with upper endoscopy. We have discussed the risk and benefits of the procedure extensively preoperatively. Please see my separate notes.  PROCEDURE: After obtaining informed consent and receiving 5000 units of subcutaneous heparin, the patient was brought to the operating room at [facility] and placed supine on the operating room table. General endotracheal anesthesia was established. Sequential compression devices were placed. A orogastric tube was placed. The patient's abdomen was prepped and draped in the usual standard surgical fashion. The patient received preoperative IV antibiotic. A surgical timeout was performed. ERAS protocol used.   Access to the abdomen was achieved using a 5 mm 0 laparoscope thru a 5 mm trocar In the left upper Quadrant 2 fingerbreadths below the left subcostal margin using the Optiview technique. Pneumoperitoneum was smoothly established up to 15 mm of mercury. The laparoscope was advanced and the abdominal cavity was surveilled. The patient was then placed in reverse Trendelenburg.   A 5 mm trocar was placed  slightly above and to the left of the umbilicus under direct visualization.  The [facility] liver retractor was placed under the left lobe of the liver through a 5 mm trocar incision site in the subxiphoid position. A 5 mm trocar was placed in the lateral right upper quadrant along with a 15 mm trocar in the mid right abdomen. A final 5 mm trocar was placed in the lateral LUQ.  All under direct visualization after exparel had been infiltrated in bilateral lateral upper abdominal walls as a TAP block.  The stomach was inspected. It was completely decompressed and the orogastric tube was removed.  There is a small anterior dimple that was obviously visible. The calibration tube was placed in the oropharynx and guided down into the stomach by the CRNA. 10 mL of air was insufflated into the calibration balloon. The calibration tubing was then gently pulled back by the CRNA and it slid past the GE junction. At this point the calibration tubing was desufflated and pulled back into the esophagus. This confirmed my suspicion of a clinically significant hiatal hernia. The gastrohepatic ligament was incised with harmonic scalpel. The right crus was identified. We identified the crossing fat along the right crus. The adipose tissue just above this area was incised with harmonic scalpel. I then bluntly dissected out this area and identified the left crus. There was evidence of a hiatal hernia. I then mobilized the esophagus. The left and right crus were further mobilized with blunt dissection. I was then able to reapproximate the left and right crus with 0 Ethibond using an Endostitch suture device and securing it with a titanium tyknot. I placed a second suture in a similar fashion. We then had the CRNA readvanced  the calibration tubing back into the stomach. 10 mL of air was insufflated into the calibration tube balloon. The calibration tube was then gently pulled back and there was resistance at the GE junction. The tube  did not slide back up into the esophagus. At this point the calibration tubing was deflated and removed from the patient's body.   We identified the pylorus and measured 6 cm proximal to the pylorus and identified an area of where we would start taking down the short gastric vessels. Harmonic scalpel was used to take down the short gastric vessels along the greater curvature of the stomach. We were able to enter the lesser sac. We continued to march along the greater curvature of the stomach taking down the short gastrics. As we approached the gastrosplenic ligament we took care in this area not to injure the spleen. We were able to take down the entire gastrosplenic ligament. The fundus was a little stuck to the diaphragm and spleen but We then mobilized the fundus away from the left crus of diaphragm. There were some posterior gastric avascular attachments which were taken down. This left the stomach completely mobilized. No vessels had been taken down along the lesser curvature of the stomach.  We then reidentified the pylorus. A 40Fr ViSiGi was then placed in the oropharynx and advanced down into the stomach and placed in the distal antrum and positioned along the lesser curvature. It was placed under suction which secured the 40Fr ViSiGi in place along the lesser curve. Then using the Ethicon echelon 60 mm stapler with a green load with Seamguard, I placed a stapler along the antrum approximately 5 cm from the pylorus. The stapler was angled so that there is ample room at the angularis incisura. I then fired the first staple load after inspecting it posteriorly to ensure adequate space both anteriorly and posteriorly. At this point I still was not completely past the angularis so with a blue load with Seamguard, I placed the stapler in position just inside the prior stapleline. We then rotated the stomach to insure that there was adequate anteriorly as well as posteriorly. The stapler was then fired.  At  this point I started using 60 mm blue load staple cartridges with Seamguard. The echelon stapler was then repositioned with a 60 mm blue load with Seamguard and we continued to march up along the ViSiGi. My assistant was holding traction along the greater curvature stomach along the cauterized short gastric vessels ensuring that the stomach was symmetrically retracted. Prior to each firing of the staple, we rotated the stomach to ensure that there is adequate stomach left.  As we approached the fundus, I used 60 mm blue cartridge with Seamguard aiming  lateral to the GE junction after mobilizing some of the esophageal fat pad.  The sleeve was inspected. There is no evidence of cork screw. The staple line appeared hemostatic. The CRNA inflated the ViSiGi to the green zone and the upper abdomen was flooded with saline. There were no bubbles. The sleeve was decompressed and the ViSiGi removed. My assistant scrubbed out and performed an upper endoscopy. The sleeve easily distended with air and the scope was easily advanced to the pylorus. There is no evidence of internal bleeding or cork screwing. There was no narrowing at the angularis. There is no evidence of bubbles. Please see his operative note for further details. The gastric sleeve was decompressed and the endoscope was removed.  The greater curvature the stomach was grasped with  a laparoscopic grasper and removed from the 15 mm trocar site.  The liver retractor was removed. I then closed the 15 mm trocar site with 1 interrupted 0 Vicryl sutures through the fascia using the endoclose. The closure was viewed laparoscopically and it was airtight. Remaining Exparel was then infiltrated in the preperitoneal spaces around the trocar sites. Pneumoperitoneum was released. All trocar sites were closed with a 4-0 Monocryl in a subcuticular fashion followed by the application of benzoin, steri-strips, and bandaids. The patient was extubated and taken to the recovery room  in stable condition. All needle, instrument, and sponge counts were correct x2. There are no immediate complications  (1) 60 mm green with Seamguard (5) 60 mm blue with 4 seamguard  PLAN OF CARE: Admit to inpatient   PATIENT DISPOSITION:  PACU - hemodynamically stable.   Delay start of Pharmacological VTE agent (>24hrs) due to surgical blood loss or risk of bleeding:  no  Leighton Ruff. Redmond Pulling, MD, FACS FASMBS General, Bariatric, & Minimally Invasive Surgery [facility], [place]

## 2019-08-30 ENCOUNTER — Ambulatory Visit: Payer: No Typology Code available for payment source

## 2019-08-30 LAB — CBC WITH DIFFERENTIAL/PLATELET
Abs Immature Granulocytes: 0.05 10*3/uL (ref 0.00–0.07)
Basophils Absolute: 0 10*3/uL (ref 0.0–0.1)
Basophils Relative: 0 %
Eosinophils Absolute: 0 10*3/uL (ref 0.0–0.5)
Eosinophils Relative: 0 %
HCT: 38.4 % (ref 36.0–46.0)
Hemoglobin: 12.4 g/dL (ref 12.0–15.0)
Immature Granulocytes: 1 %
Lymphocytes Relative: 8 %
Lymphs Abs: 0.9 10*3/uL (ref 0.7–4.0)
MCH: 30.2 pg (ref 26.0–34.0)
MCHC: 32.3 g/dL (ref 30.0–36.0)
MCV: 93.4 fL (ref 80.0–100.0)
Monocytes Absolute: 0.6 10*3/uL (ref 0.1–1.0)
Monocytes Relative: 5 %
Neutro Abs: 9.5 10*3/uL — ABNORMAL HIGH (ref 1.7–7.7)
Neutrophils Relative %: 86 %
Platelets: 157 10*3/uL (ref 150–400)
RBC: 4.11 MIL/uL (ref 3.87–5.11)
RDW: 12.5 % (ref 11.5–15.5)
WBC: 11 10*3/uL — ABNORMAL HIGH (ref 4.0–10.5)
nRBC: 0 % (ref 0.0–0.2)

## 2019-08-30 LAB — COMPREHENSIVE METABOLIC PANEL
ALT: 25 U/L (ref 0–44)
AST: 24 U/L (ref 15–41)
Albumin: 3.9 g/dL (ref 3.5–5.0)
Alkaline Phosphatase: 70 U/L (ref 38–126)
Anion gap: 6 (ref 5–15)
BUN: 7 mg/dL (ref 6–20)
CO2: 25 mmol/L (ref 22–32)
Calcium: 8.5 mg/dL — ABNORMAL LOW (ref 8.9–10.3)
Chloride: 104 mmol/L (ref 98–111)
Creatinine, Ser: 0.8 mg/dL (ref 0.44–1.00)
GFR calc Af Amer: >60 mL/min (ref >60)
GFR calc non Af Amer: >60 mL/min (ref >60)
Glucose, Bld: 161 mg/dL — ABNORMAL HIGH (ref 70–99)
Potassium: 5.2 mmol/L — ABNORMAL HIGH (ref 3.5–5.1)
Sodium: 135 mmol/L (ref 135–145)
Total Bilirubin: 0.6 mg/dL (ref 0.3–1.2)
Total Protein: 7.5 g/dL (ref 6.5–8.1)

## 2019-08-30 LAB — SURGICAL PATHOLOGY

## 2019-08-30 MED ORDER — ONDANSETRON 4 MG PO TBDP
4.0000 mg | ORAL_TABLET | Freq: Four times a day (QID) | ORAL | 0 refills | Status: DC | PRN
Start: 2019-08-30 — End: 2020-01-20

## 2019-08-30 MED ORDER — TRAMADOL HCL 50 MG PO TABS
50.0000 mg | ORAL_TABLET | Freq: Four times a day (QID) | ORAL | 0 refills | Status: DC | PRN
Start: 2019-08-30 — End: 2020-01-20

## 2019-08-30 MED ORDER — GABAPENTIN 100 MG PO CAPS
200.0000 mg | ORAL_CAPSULE | Freq: Two times a day (BID) | ORAL | 0 refills | Status: DC
Start: 2019-08-30 — End: 2020-01-20

## 2019-08-30 MED ORDER — PANTOPRAZOLE SODIUM 40 MG PO TBEC
40.0000 mg | DELAYED_RELEASE_TABLET | Freq: Every day | ORAL | 0 refills | Status: DC
Start: 2019-08-30 — End: 2020-01-20

## 2019-08-30 MED ORDER — ACETAMINOPHEN 500 MG PO TABS
1000.0000 mg | ORAL_TABLET | Freq: Three times a day (TID) | ORAL | 0 refills | Status: AC
Start: 2019-08-30 — End: 2019-09-04

## 2019-08-30 MED ORDER — SODIUM CHLORIDE 0.9 % IV SOLN: INTRAVENOUS | Status: DC

## 2019-08-30 MED FILL — GABAPENTIN 100 MG CAPSULE: 100 | 5 days supply | Qty: 20 | Fill #0

## 2019-08-30 MED FILL — PANTOPRAZOLE SOD DR 40 MG T: 40 | 90 days supply | Qty: 90 | Fill #0

## 2019-08-30 MED FILL — ONDANSETRON ODT 4 MG TABLET: 4 | 5 days supply | Qty: 20 | Fill #0

## 2019-08-30 MED FILL — traMADol HCL 50 MG TABS: 50 | 3 days supply | Qty: 10 | Fill #0

## 2019-08-30 NOTE — Progress Notes (Signed)
Patient alert and oriented, pain is controlled. Patient is tolerating fluids, advanced to protein shake today, patient is tolerating well.  Reviewed Gastric sleeve discharge instructions with patient and patient is able to articulate understanding.  Provided information on BELT program, Support Group and [facility] outpatient pharmacy. All questions answered, will continue to monitor.  Total fluid intake 720 Per dehydration protocol call back

## 2019-08-30 NOTE — Progress Notes (Signed)
Discharge instructions discussed with patient and family, verbalized agreement and understanding 

## 2019-08-30 NOTE — Discharge Summary (Addendum)
Physician Discharge Summary  Robin Little HFG:[id number] DOB: March 10, 1986 DOA: 08/29/2019  PCP: Jearld Fenton, NP  Admit date: 08/29/2019 Discharge date: 08/30/2019  Recommendations for Outpatient Follow-up:   Follow-up Information    Greer Pickerel, MD. Go on 09/21/2019.   Specialty: General Surgery Why: at 1045 am.  Please arrive 15 minutes prior to your appointment time.  Thank you Contact information: [address] [place] [address] [phone]        Carlena Hurl, PA-C. Go on 10/27/2019.   Specialty: General Surgery Why: at 915.  Please arrive 15 minutes prior to your appointment time.  Thank you Contact information: [place] [address] [phone]          Discharge Diagnoses:  Active Problems:   S/P laparoscopic sleeve gastrectomy hypercholesterinemia Hyperlipidemia Fatty liver  Surgical Procedure: Laparoscopic Sleeve Gastrectomy with hiatal hernia repair, upper endoscopy  Discharge Condition: Good Disposition: Home  Diet recommendation: Postoperative sleeve gastrectomy diet (liquids only)  Filed Weights   08/29/19 1022  Weight: 100.5 kg     Hospital Course:  The patient was admitted for a planned laparoscopic sleeve gastrectomy. Please see operative note. Preoperatively the patient was given 5000 units of subcutaneous heparin for DVT prophylaxis. Postoperative prophylactic Lovenox dosing was started on the evening of postoperative day 0. ERAS protocol was used. On the evening of postoperative day 0, the patient was started on water and ice chips. On postoperative day 1 the patient had no fever or tachycardia and was tolerating water in their diet was gradually advanced throughout the day. The patient was ambulating without difficulty. Their vital signs are stable without fever or tachycardia. Their hemoglobin had remained stable. The patient had received discharge instructions and counseling. They were deemed stable  for discharge and had met discharge criteria  BP 127/83 (BP Location: Right Arm)   Pulse 64   Temp 98.4 F (36.9 C) (Oral)   Resp 18   Ht '5\' 5"'  (1.651 m)   Wt 100.5 kg   LMP 08/02/2019 (Approximate)   SpO2 100%   BMI 36.88 kg/m   Gen: alert, NAD, non-toxic appearing Pupils: equal, no scleral icterus Pulm: Lungs clear to auscultation, symmetric chest rise CV: regular rate and rhythm Abd: soft, min tender, nondistended. Well-healed trocar sites. No cellulitis. No incisional hernia Ext: no edema, no calf tenderness Skin: no rash, no jaundice  Discharge Instructions  Discharge Instructions    Ambulate hourly while awake   Complete by: As directed    Call MD for:  difficulty breathing, headache or visual disturbances   Complete by: As directed    Call MD for:  persistant dizziness or light-headedness   Complete by: As directed    Call MD for:  persistant nausea and vomiting   Complete by: As directed    Call MD for:  redness, tenderness, or signs of infection (pain, swelling, redness, odor or green/yellow discharge around incision site)   Complete by: As directed    Call MD for:  severe uncontrolled pain   Complete by: As directed    Call MD for:  temperature >101 F   Complete by: As directed    Diet bariatric full liquid   Complete by: As directed    Discharge instructions   Complete by: As directed    See bariatric discharge instructions   Incentive spirometry   Complete by: As directed    Perform hourly while awake  Allergies as of 08/30/2019   No Known Allergies     Medication List    STOP taking these medications   ibuprofen 200 MG tablet Commonly known as: ADVIL     TAKE these medications   acetaminophen 500 MG tablet Commonly known as: TYLENOL Take 2 tablets (1,000 mg total) by mouth every 8 (eight) hours for 5 days. What changed:   how much to take  when to take this  reasons to take this   buPROPion 300 MG 24 hr tablet Commonly known as:  WELLBUTRIN XL TAKE 1 TABLET (300 MG TOTAL) BY MOUTH DAILY.   butalbital-acetaminophen-caffeine 50-325-40 MG tablet Commonly known as: FIORICET Take 1-2 tablets by mouth every 6 (six) hours as needed for headache.   cetirizine 10 MG tablet Commonly known as: ZYRTEC Take 10 mg by mouth daily.   gabapentin 100 MG capsule Commonly known as: NEURONTIN Take 2 capsules (200 mg total) by mouth every 12 (twelve) hours.   Nasacort Allergy 24HR 55 MCG/ACT Aero nasal inhaler Generic drug: triamcinolone Place 1 spray into the nose daily as needed (allergies.).   ondansetron 4 MG disintegrating tablet Commonly known as: ZOFRAN-ODT Take 1 tablet (4 mg total) by mouth every 6 (six) hours as needed for nausea or vomiting.   pantoprazole 40 MG tablet Commonly known as: PROTONIX Take 1 tablet (40 mg total) by mouth daily.   sertraline 100 MG tablet Commonly known as: ZOLOFT TAKE 2 TABLETS (200 MG TOTAL) BY MOUTH DAILY.   traMADol 50 MG tablet Commonly known as: ULTRAM Take 1 tablet (50 mg total) by mouth every 6 (six) hours as needed (pain).      Follow-up Information    Greer Pickerel, MD. Go on 09/21/2019.   Specialty: General Surgery Why: at 1045 am.  Please arrive 15 minutes prior to your appointment time.  Thank you Contact information: [address] [place] [address] [phone]        Carlena Hurl, PA-C. Go on 10/27/2019.   Specialty: General Surgery Why: at 915.  Please arrive 15 minutes prior to your appointment time.  Thank you Contact information: [address] [place] [address] [phone]            The results of significant diagnostics from this hospitalization (including imaging, microbiology, ancillary and laboratory) are listed below for reference.    Significant Diagnostic Studies: No results found.  Labs: Basic Metabolic Panel: Recent Labs  Lab 08/26/19 1458 08/30/19 0457  NA 137 135  K 4.1 5.2*  CL 106 104  CO2 23  25  GLUCOSE 82 161*  BUN 10 7  CREATININE 0.78 0.80  CALCIUM 8.4* 8.5*   Liver Function Tests: Recent Labs  Lab 08/26/19 1458 08/30/19 0457  AST 18 24  ALT 16 25  ALKPHOS 66 70  BILITOT 0.4 0.6  PROT 7.6 7.5  ALBUMIN 4.2 3.9    CBC: Recent Labs  Lab 08/26/19 1458 08/29/19 1448 08/30/19 0457  WBC 6.4  --  11.0*  NEUTROABS 4.1  --  9.5*  HGB 13.1 13.2 12.4  HCT 41.5 41.3 38.4  MCV 94.5  --  93.4  PLT 154  --  157    CBG: No results for input(s): GLUCAP in the last 168 hours.  Active Problems:   S/P laparoscopic sleeve gastrectomy   Time coordinating discharge: 15 min  Signed:  Gayland Curry, MD [facility], [place] [phone] 08/30/2019, 11:00 AM

## 2019-08-30 NOTE — Progress Notes (Addendum)
Patient alert and oriented, Post op day 1.  Provided support and encouragement.  Encouraged pulmonary toilet, ambulation and small sips of liquids.  Completed 12 ounces of bari clear fluid, 6 ounces of protein.  All questions answered.  Will continue to monitor.

## 2019-08-30 NOTE — Progress Notes (Signed)
Discharge instructions were given to patient and family.  All questions were answered.  Patient walked to the main entrance with the Nurse Tech.

## 2019-08-30 NOTE — Progress Notes (Signed)
NUTRITION NOTE   Consult received for Bariatric Diet teaching. Patient is POD #1 Laparoscopic Sleeve Gastrectomy with Hiatal Hernia Repair and EGD. Bariatric Nurse Educator to provide all post-op/pre-discharge instruction/information including diet-related information.   If additional nutrition-related needs arise, please re-consult RD.   Lars Masson, RD, LDN Clinical Nutrition After Hours/Weekend Pager # in Amion

## 2019-09-01 ENCOUNTER — Encounter: Payer: Self-pay | Admitting: *Deleted

## 2019-09-01 ENCOUNTER — Other Ambulatory Visit: Payer: Self-pay | Admitting: *Deleted

## 2019-09-01 NOTE — Patient Outreach (Signed)
[facility]) Care Management  09/01/2019  Robin Little 1986/03/01 [id number]   Transition of care call/case closure   Referral received:08/30/19 Initial outreach:09/01/19 Insurance: [employer]    Subjective: Initial successful telephone call to patient's preferred number in order to complete transition of care assessment; 2 HIPAA identifiers verified. Explained purpose of call and completed transition of care assessment.  Robin Little states she is getting better each day,  denies post-operative problems, says surgical incisions are unremarkable, states surgical pain well managed with prescribed medications. She  tolerating diet meeting protein and clear fluid goals, she denies having nausea. She reports not having a bowel movement yet and plans to begin recommend treatment with milk of magnesia or miralax per discharge instructions. Patient spouse and her mother is  assisting with her  recovery.  She does not have the hospital indemnity She does  use a [facility] outpatient pharmacy, at [facility]  Objective:   Robin Little  was hospitalized at [facility] from 5/3-08/30/19 for Gastric sleeve resection Comorbidities include: GERD, Obesity  She was discharged to home on 08/30/19 without the need for home health services or DME.   Assessment:  Patient voices good understanding of all discharge instructions.  See transition of care flowsheet for assessment details.   Plan:  Reviewed hospital discharge diagnosis of Gastric sleeve Resection  and discharge treatment plan using hospital discharge instructions, assessing medication adherence, reviewing problems requiring provider notification, and discussing the importance of follow up with surgeon, primary care provider and/or specialists as directed. Reviewed  healthy lifestyle program information to receive discounted premium for  2022   Step 1: Get  your annual physical  Step 2: Complete your health  assessment Step 3:Identify your current health status and complete the corresponding action step between January 1, and December 28, 2019.    No ongoing care management needs identified so will close case to [facility] Care Management services and route successful outreach letter with [facility] Care Management pamphlet and 24 Hour Nurse Line Magnet to [employer] Care Management clinical pool to be mailed to patient's home address.  Thanked patient for their services to [facility] and happy nurses day.    Egbert Garibaldi, RN, BSN  [facility] Care Management,Care Management Coordinator  [phone]- Mobile [phone]- Toll Free Main Office

## 2019-09-05 ENCOUNTER — Telehealth (HOSPITAL_COMMUNITY): Payer: Self-pay

## 2019-09-05 NOTE — Telephone Encounter (Signed)
Patient called to discuss post bariatric surgery follow up questions.  See below:   1.  Tell me about your pain and pain management?denies at this time, incisional pain at first   2.  Let's talk about fluid intake.  How much total fluid are you taking in?64-80 ounces  3.  How much protein have you taken in the last 2 days?60 grams of protein  4.  Have you had nausea?  Tell me about when have experienced nausea and what you did to help?denies  5.  Has the frequency or color changed with your urine?light In color  6.  Tell me what your incisions look like?no problems steri are coming off  7.  Have you been passing gas? BM?had BM diarrhea  8.  If a problem or question were to arise who would you call?  Do you know contact numbers for [facility], [facility], and [facility]?aware of how to contact all services  9.  How has the walking going?no problems with walking around home  10.  How are your vitamins and calcium going?  How are you taking them?ordered some new vitamins for today, calcium started

## 2019-09-13 ENCOUNTER — Other Ambulatory Visit: Payer: Self-pay

## 2019-09-13 ENCOUNTER — Encounter: Payer: Self-pay | Admitting: Internal Medicine

## 2019-09-13 ENCOUNTER — Encounter: Payer: No Typology Code available for payment source | Attending: General Surgery | Admitting: Skilled Nursing Facility1

## 2019-09-13 DIAGNOSIS — E669 Obesity, unspecified: Secondary | ICD-10-CM | POA: Diagnosis not present

## 2019-09-14 MED ORDER — SERTRALINE HCL 100 MG PO TABS: 150.0000 mg | ORAL_TABLET | Freq: Every day | ORAL | 0 refills | Status: DC

## 2019-09-14 MED ORDER — BUPROPION HCL ER (XL) 150 MG PO TB24
150.0000 mg | ORAL_TABLET | Freq: Every day | ORAL | 0 refills | Status: DC
Start: 2019-09-14 — End: 2020-01-20

## 2019-09-14 MED FILL — buPROPion HCL ER (XL) 150 M: 150 | 90 days supply | Qty: 90 | Fill #0

## 2019-09-14 MED FILL — SERTRALINE HCL 100 MG TAB: 100 | 90 days supply | Qty: 135 | Fill #0

## 2019-09-14 NOTE — Progress Notes (Signed)
2 Week Post-Operative Nutrition Class   Patient was seen on 06/22/18 for Post-Operative Nutrition education at the [facility].    Surgery date: 08/29/2019 Surgery type: sleeve Start weight at [facility]: 220 Weight today: 206.5   Body Composition Scale 09/13/2019  Total Body Fat % 39.3  Visceral Fat 9  Fat-Free Mass % 60.6   Total Body Water % 44.8   Muscle-Mass lbs 32.3  Body Fat Displacement          Torso  lbs 50.2         Left Leg  lbs 10         Right Leg  lbs 10         Left Arm  lbs 5         Right Arm   lbs 5     The following the learning objectives were met by the patient during this course:  Identifies Phase 3 (Soft, High Proteins) Dietary Goals and will begin from 2 weeks post-operatively to 2 months post-operatively  Identifies appropriate sources of fluids and proteins   States protein recommendations and appropriate sources post-operatively  Identifies the need for appropriate texture modifications, mastication, and bite sizes when consuming solids  Identifies appropriate multivitamin and calcium sources post-operatively  Describes the need for physical activity post-operatively and will follow MD recommendations  States when to call healthcare provider regarding medication questions or post-operative complications   Handouts given during class include:  Phase 3A: Soft, High Protein Diet Handout   Follow-Up Plan: Patient will follow-up at [facility] in 6 weeks for 2 month post-op nutrition visit for diet advancement per MD.

## 2019-09-14 NOTE — Addendum Note (Signed)
Addended by: Lorre Munroe on: 09/14/2019 02:26 PM   Modules accepted: Orders

## 2019-09-19 ENCOUNTER — Telehealth: Payer: Self-pay | Admitting: Skilled Nursing Facility1

## 2019-09-19 NOTE — Telephone Encounter (Signed)
RD called pt to verify fluid intake once starting soft, solid proteins 2 week post-bariatric surgery.   Daily Fluid intake: 64 oz Daily Protein intake: 60 g   Concerns/issues:   No concerns.  

## 2019-11-01 ENCOUNTER — Encounter: Payer: No Typology Code available for payment source | Attending: General Surgery | Admitting: Dietician

## 2019-11-01 ENCOUNTER — Encounter: Payer: Self-pay | Admitting: Dietician

## 2019-11-01 ENCOUNTER — Other Ambulatory Visit: Payer: Self-pay

## 2019-11-01 DIAGNOSIS — E669 Obesity, unspecified: Secondary | ICD-10-CM | POA: Insufficient documentation

## 2019-11-01 NOTE — Progress Notes (Signed)
Bariatric Nutrition Follow-Up Visit Medical Nutrition Therapy  Appt Start Time: 7:35am    End Time: 8:00am  2 Months Post-Operative Sleeve Gastrectomy Surgery Surgery Date: 08/29/2019  Pt's Expectations of Surgery/ Goals: to lose weight and keep it off, live a longer healthier life, be a good example for kids to establish good habits with them early on Pt Reported Successes: more energy to play with kids, wedding ring fits better, weight loss    NUTRITION ASSESSMENT  Anthropometrics  Start weight at [facility]: 220 lbs (date: 02/10/2020) Today's weight: 189.3 lbs  Body Composition Scale 09/13/2019 11/01/2019  Weight  lbs 206.5 189.3  BMI 34.4 31.2  Total Body Fat  % 39.3 36.5     Visceral Fat 9 8  Fat-Free Mass  % 60.6 63.4     Total Body Water  % 44.8 46.2     Muscle-Mass  lbs 32.3 32.2  Body Fat Displacement --- ---         Torso  lbs 50.2 42.7         Left Leg  lbs 10 8.5         Right Leg  lbs 10 8.5         Left Arm  lbs 5 4.2         Right Arm  lbs 5 4.2    Lifestyle & Dietary Hx Patient states she has had no major issues since surgery, other than some constipation during the first 2 weeks postop. Meeting daily protein and fluid goals, eating only protein sources.    24-Hr Dietary Recall First Meal: hard boiled egg + [place] bacon (or protein shake)  Snack: -  Second Meal: [employer] [place] chili  Snack: cheese stick   Third Meal: chicken (or black eyed peas) (or yogurt)  Snack: - Beverages: water   Estimated daily fluid intake: 80 oz Estimated daily protein intake: 60 g Supplements: Centrum MVI 2x/day, Tums 3x/day Current average weekly physical activity: Peloton workouts   Post-Op Goals/ Signs/ Symptoms Using straws: no Drinking while eating: no Chewing/swallowing difficulties: no Changes in vision: no Changes to mood/headaches: no Hair loss/changes to skin/nails: mild hair loss Difficulty focusing/concentrating: no Sweating: no Dizziness/lightheadedness:  no Palpitations: no  Carbonated/caffeinated beverages: no N/V/D/C/Gas: no Abdominal pain: no Dumping syndrome: no   NUTRITION DIAGNOSIS  Overweight/obesity ([place]-3.3) related to past poor dietary habits and physical inactivity as evidenced by completed bariatric surgery and following dietary guidelines for continued weight loss and healthy nutrition status.   NUTRITION INTERVENTION Nutrition counseling (C-1) and education (E-2) to facilitate bariatric surgery goals, including: . Diet advancement to the next phase (phase 4) now including non-starchy vegetables  . The importance of consuming adequate calories as well as certain nutrients daily due to the body's need for essential vitamins, minerals, and fats . The importance of daily physical activity and to reach a goal of at least 150 minutes of moderate to vigorous physical activity weekly (or as directed by their physician) due to benefits such as increased musculature and improved lab values  Handouts Provided Include   Phase 4: Protein + Non-Starchy Vegetables   Learning Style & Readiness for Change Teaching method utilized: Visual & Auditory  Demonstrated degree of understanding via: Teach Back  Barriers to learning/adherence to lifestyle change: None Identified    MONITORING & EVALUATION Dietary intake, weekly physical activity, body weight, and goals in 4 months.  Next Steps Patient is to follow-up in 4 months for 6 month post-op follow-up.

## 2019-11-01 NOTE — Patient Instructions (Signed)

## 2019-11-07 ENCOUNTER — Encounter: Payer: Self-pay | Admitting: Internal Medicine

## 2019-11-10 ENCOUNTER — Ambulatory Visit: Payer: No Typology Code available for payment source | Admitting: Internal Medicine

## 2019-12-20 ENCOUNTER — Encounter: Payer: No Typology Code available for payment source | Admitting: Internal Medicine

## 2020-01-09 ENCOUNTER — Encounter: Payer: No Typology Code available for payment source | Admitting: Internal Medicine

## 2020-01-13 ENCOUNTER — Encounter: Payer: Self-pay | Admitting: Internal Medicine

## 2020-01-13 NOTE — Telephone Encounter (Signed)
I can see her Monday but not today.

## 2020-01-20 ENCOUNTER — Other Ambulatory Visit: Payer: Self-pay | Admitting: Internal Medicine

## 2020-01-20 ENCOUNTER — Other Ambulatory Visit: Payer: Self-pay

## 2020-01-20 ENCOUNTER — Ambulatory Visit (INDEPENDENT_AMBULATORY_CARE_PROVIDER_SITE_OTHER): Payer: No Typology Code available for payment source | Admitting: Internal Medicine

## 2020-01-20 ENCOUNTER — Encounter: Payer: Self-pay | Admitting: Internal Medicine

## 2020-01-20 VITALS — BP 118/70 | HR 60 | Temp 98.2°F | Ht 65.5 in | Wt 170.0 lb

## 2020-01-20 DIAGNOSIS — R519 Headache, unspecified: Secondary | ICD-10-CM | POA: Diagnosis not present

## 2020-01-20 DIAGNOSIS — Z23 Encounter for immunization: Secondary | ICD-10-CM

## 2020-01-20 DIAGNOSIS — R41840 Attention and concentration deficit: Secondary | ICD-10-CM

## 2020-01-20 DIAGNOSIS — Z0000 Encounter for general adult medical examination without abnormal findings: Secondary | ICD-10-CM | POA: Diagnosis not present

## 2020-01-20 DIAGNOSIS — K219 Gastro-esophageal reflux disease without esophagitis: Secondary | ICD-10-CM

## 2020-01-20 DIAGNOSIS — F419 Anxiety disorder, unspecified: Secondary | ICD-10-CM

## 2020-01-20 MED ORDER — PANTOPRAZOLE SODIUM 20 MG PO TBEC: 20.0000 mg | DELAYED_RELEASE_TABLET | Freq: Every day | ORAL | 1 refills | Status: DC

## 2020-01-20 MED ORDER — SERTRALINE HCL 100 MG PO TABS: 100.0000 mg | ORAL_TABLET | Freq: Every day | ORAL | 3 refills | Status: DC

## 2020-01-20 NOTE — Addendum Note (Signed)
Addended by: Alvina Chou on: 01/20/2020 03:49 PM   Modules accepted: Orders

## 2020-01-20 NOTE — Assessment & Plan Note (Signed)
Deteriorated Will restart Pantoprazole 20 mg daily She continues to work on weight loss CBC and CMET today

## 2020-01-20 NOTE — Patient Instructions (Signed)
Health Maintenance, Female Adopting a healthy lifestyle and getting preventive care are important in promoting health and wellness. Ask your health care provider about:  The right schedule for you to have regular tests and exams.  Things you can do on your own to prevent diseases and keep yourself healthy. What should I know about diet, weight, and exercise? Eat a healthy diet   Eat a diet that includes plenty of vegetables, fruits, low-fat dairy products, and lean protein.  Do not eat a lot of foods that are high in solid fats, added sugars, or sodium. Maintain a healthy weight Body mass index (BMI) is used to identify weight problems. It estimates body fat based on height and weight. Your health care provider can help determine your BMI and help you achieve or maintain a healthy weight. Get regular exercise Get regular exercise. This is one of the most important things you can do for your health. Most adults should:  Exercise for at least 150 minutes each week. The exercise should increase your heart rate and make you sweat (moderate-intensity exercise).  Do strengthening exercises at least twice a week. This is in addition to the moderate-intensity exercise.  Spend less time sitting. Even light physical activity can be beneficial. Watch cholesterol and blood lipids Have your blood tested for lipids and cholesterol at 35 years of age, then have this test every 5 years. Have your cholesterol levels checked more often if:  Your lipid or cholesterol levels are high.  You are older than 35 years of age.  You are at high risk for heart disease. What should I know about cancer screening? Depending on your health history and family history, you may need to have cancer screening at various ages. This may include screening for:  Breast cancer.  Cervical cancer.  Colorectal cancer.  Skin cancer.  Lung cancer. What should I know about heart disease, diabetes, and high blood  pressure? Blood pressure and heart disease  High blood pressure causes heart disease and increases the risk of stroke. This is more likely to develop in people who have high blood pressure readings, are of African descent, or are overweight.  Have your blood pressure checked: ? Every 3-5 years if you are 18-39 years of age. ? Every year if you are 40 years old or older. Diabetes Have regular diabetes screenings. This checks your fasting blood sugar level. Have the screening done:  Once every three years after age 40 if you are at a normal weight and have a low risk for diabetes.  More often and at a younger age if you are overweight or have a high risk for diabetes. What should I know about preventing infection? Hepatitis B If you have a higher risk for hepatitis B, you should be screened for this virus. Talk with your health care provider to find out if you are at risk for hepatitis B infection. Hepatitis C Testing is recommended for:  Everyone born from 1945 through 1965.  Anyone with known risk factors for hepatitis C. Sexually transmitted infections (STIs)  Get screened for STIs, including gonorrhea and chlamydia, if: ? You are sexually active and are younger than 35 years of age. ? You are older than 35 years of age and your health care provider tells you that you are at risk for this type of infection. ? Your sexual activity has changed since you were last screened, and you are at increased risk for chlamydia or gonorrhea. Ask your health care provider if   you are at risk.  Ask your health care provider about whether you are at high risk for HIV. Your health care provider may recommend a prescription medicine to help prevent HIV infection. If you choose to take medicine to prevent HIV, you should first get tested for HIV. You should then be tested every 3 months for as long as you are taking the medicine. Pregnancy  If you are about to stop having your period (premenopausal) and  you may become pregnant, seek counseling before you get pregnant.  Take 400 to 800 micrograms (mcg) of folic acid every day if you become pregnant.  Ask for birth control (contraception) if you want to prevent pregnancy. Osteoporosis and menopause Osteoporosis is a disease in which the bones lose minerals and strength with aging. This can result in bone fractures. If you are 65 years old or older, or if you are at risk for osteoporosis and fractures, ask your health care provider if you should:  Be screened for bone loss.  Take a calcium or vitamin D supplement to lower your risk of fractures.  Be given hormone replacement therapy (HRT) to treat symptoms of menopause. Follow these instructions at home: Lifestyle  Do not use any products that contain nicotine or tobacco, such as cigarettes, e-cigarettes, and chewing tobacco. If you need help quitting, ask your health care provider.  Do not use street drugs.  Do not share needles.  Ask your health care provider for help if you need support or information about quitting drugs. Alcohol use  Do not drink alcohol if: ? Your health care provider tells you not to drink. ? You are pregnant, may be pregnant, or are planning to become pregnant.  If you drink alcohol: ? Limit how much you use to 0-1 drink a day. ? Limit intake if you are breastfeeding.  Be aware of how much alcohol is in your drink. In the [place] one drink equals one 12 oz bottle of beer (355 mL), one 5 oz glass of wine (148 mL), or one 1 oz glass of hard liquor (44 mL). General instructions  Schedule regular health, dental, and eye exams.  Stay current with your vaccines.  Tell your health care provider if: ? You often feel depressed. ? You have ever been abused or do not feel safe at home. Summary  Adopting a healthy lifestyle and getting preventive care are important in promoting health and wellness.  Follow your health care provider's instructions about healthy  diet, exercising, and getting tested or screened for diseases.  Follow your health care provider's instructions on monitoring your cholesterol and blood pressure. This information is not intended to replace advice given to you by your health care provider. Make sure you discuss any questions you have with your health care provider. Document Revised: 04/07/2018 Document Reviewed: 04/07/2018 Elsevier Patient Education  2020 Elsevier Inc.  

## 2020-01-20 NOTE — Assessment & Plan Note (Signed)
Stable off meds °Will monitor °

## 2020-01-20 NOTE — Assessment & Plan Note (Signed)
Resolved  Will monitor

## 2020-01-20 NOTE — Assessment & Plan Note (Signed)
She plans to wean off Sertraline, she is aware of how to do this Support offered

## 2020-01-20 NOTE — Progress Notes (Signed)
Subjective:    Patient ID: Robin Little, female    DOB: December 06, 1985, 34 y.o.   MRN: [id number]  HPI  Pt presents to the clinic today for her annual exam. She is also due to follow up chronic conditions.  GERD: Intermittent. She was started on Pantoprazole after her gastric sleeve, but reports she is not currently taking this. She is taking Tums but would like to get started back on this. There is no upper GI on file.   Inattention: Currently not an issue. She is no longer taking Wellbutrin.   Anxiety: Persistent, stable on Sertraline. She stopped taking Wellbutrin. She does not see a therapist. She denies depression, SI/HI.  Frequent Headaches: Triggered by stress. These have essentially resolved. She no longer takes any medications for this. She does not follow with neurology.  Flu: 12/2016 Tetanus: 06/2014 Covid: Pfizer Pap Smear: 10/2016, [facility] Dentist: as needed  Diet: She does eat meat. She consumes some veggies, no fruits. She tries to avoid fried foods. She drinks mostly water. Exercise: Peloton  Review of Systems      Past Medical History:  Diagnosis Date  . Anemia    thrombocytopenia was when pregnant  . Anxiety   . Depression   . GERD (gastroesophageal reflux disease)   . Headache    migraines  . History of shingles   . Hyperlipidemia   . IBS (irritable bowel syndrome)   . Obesity   . Pneumonia    as a kid    Current Outpatient Medications  Medication Sig Dispense Refill  . buPROPion (WELLBUTRIN XL) 150 MG 24 hr tablet Take 1 tablet (150 mg total) by mouth daily. 90 tablet 0  . butalbital-acetaminophen-caffeine (FIORICET) 50-325-40 MG tablet Take 1-2 tablets by mouth every 6 (six) hours as needed for headache. 20 tablet 0  . cetirizine (ZYRTEC) 10 MG tablet Take 10 mg by mouth daily.    Marland Kitchen gabapentin (NEURONTIN) 100 MG capsule Take 2 capsules (200 mg total) by mouth every 12 (twelve) hours. 20 capsule 0  . ondansetron (ZOFRAN-ODT) 4 MG  disintegrating tablet Take 1 tablet (4 mg total) by mouth every 6 (six) hours as needed for nausea or vomiting. 20 tablet 0  . pantoprazole (PROTONIX) 40 MG tablet Take 1 tablet (40 mg total) by mouth daily. 90 tablet 0  . sertraline (ZOLOFT) 100 MG tablet Take 1.5 tablets (150 mg total) by mouth daily. 135 tablet 0  . traMADol (ULTRAM) 50 MG tablet Take 1 tablet (50 mg total) by mouth every 6 (six) hours as needed (pain). 10 tablet 0  . triamcinolone (NASACORT ALLERGY 24HR) 55 MCG/ACT AERO nasal inhaler Place 1 spray into the nose daily as needed (allergies.).     No current facility-administered medications for this visit.    No Known Allergies  Family History  Problem Relation Age of Onset  . Hyperlipidemia Mother   . Hypertension Mother   . Arthritis Mother   . Hypothyroidism Mother   . Hyperlipidemia Father   . Hypertension Father   . Cancer Maternal Grandmother        melanoma  . Goiter Maternal Grandmother   . Cancer Paternal Grandmother        liver  . Diabetes Maternal Grandfather   . Parkinson's disease Maternal Grandfather   . Hyperlipidemia Maternal Grandfather   . Hypertension Maternal Grandfather   . Alcohol abuse Paternal Grandfather     Social History   Socioeconomic History  . Marital status: Married  Spouse name: Not on file  . Number of children: 2  . Years of education: Not on file  . Highest education level: Master's degree (e.g., MA, MS, MEng, MEd, MSW, MBA)  Occupational History  . Not on file  Tobacco Use  . Smoking status: Never Smoker  . Smokeless tobacco: Never Used  Vaping Use  . Vaping Use: Never used  Substance and Sexual Activity  . Alcohol use: Yes    Comment: social  . Drug use: No  . Sexual activity: Yes    Birth control/protection: None    Comment: Husband got vasectomy  Other Topics Concern  . Not on file  Social History Narrative  . Not on file   Social Determinants of Health   Financial Resource Strain:   .  Difficulty of Paying Living Expenses: Not on file  Food Insecurity:   . Worried About Programme researcher, broadcasting/film/video in the Last Year: Not on file  . Ran Out of Food in the Last Year: Not on file  Transportation Needs:   . Lack of Transportation (Medical): Not on file  . Lack of Transportation (Non-Medical): Not on file  Physical Activity:   . Days of Exercise per Week: Not on file  . Minutes of Exercise per Session: Not on file  Stress:   . Feeling of Stress : Not on file  Social Connections:   . Frequency of Communication with Friends and Family: Not on file  . Frequency of Social Gatherings with Friends and Family: Not on file  . Attends Religious Services: Not on file  . Active Member of Clubs or Organizations: Not on file  . Attends Banker Meetings: Not on file  . Marital Status: Not on file  Intimate Partner Violence:   . Fear of Current or Ex-Partner: Not on file  . Emotionally Abused: Not on file  . Physically Abused: Not on file  . Sexually Abused: Not on file     Constitutional: Denies fever, malaise, fatigue, headache or abrupt weight changes.  HEENT: Denies eye pain, eye redness, ear pain, ringing in the ears, wax buildup, runny nose, nasal congestion, bloody nose, or sore throat. Respiratory: Denies difficulty breathing, shortness of breath, cough or sputum production.   Cardiovascular: Denies chest pain, chest tightness, palpitations or swelling in the hands or feet.  Gastrointestinal: Denies abdominal pain, bloating, constipation, diarrhea or blood in the stool.  GU: Denies urgency, frequency, pain with urination, burning sensation, blood in urine, odor or discharge. Musculoskeletal: Denies decrease in range of motion, difficulty with gait, muscle pain or joint pain and swelling.  Skin: Denies redness, rashes, lesions or ulcercations.  Neurological: Denies dizziness, difficulty with memory, difficulty with speech or problems with balance and coordination.  Psych:  Pt has a history of anxiety. Denies depression, SI/HI.  No other specific complaints in a complete review of systems (except as listed in HPI above).  Objective:   Physical Exam  BP 118/70   Pulse 60   Temp 98.2 F (36.8 C) (Temporal)   Ht 5' 5.5" (1.664 m)   Wt 170 lb (77.1 kg)   LMP 12/27/2019   SpO2 98%   BMI 27.86 kg/m   Wt Readings from Last 3 Encounters:  11/01/19 189 lb 4.8 oz (85.9 kg)  09/14/19 206 lb 8 oz (93.7 kg)  08/29/19 221 lb 9.6 oz (100.5 kg)    General: Appears her stated age, well developed, well nourished in NAD. Skin: Warm, dry and intact. No rashes  noted. HEENT: Head: normal shape and size; Eyes: sclera white, no icterus, conjunctiva pink, PERRLA and EOMs intact;  Neck:  Neck supple, trachea midline. No masses, lumps or thyromegaly present.  Cardiovascular: Normal rate and rhythm. S1,S2 noted.  No murmur, rubs or gallops noted. No JVD or BLE edema. Pulmonary/Chest: Normal effort and positive vesicular breath sounds. No respiratory distress. No wheezes, rales or ronchi noted.  Abdomen: Soft and nontender. Normal bowel sounds. No distention or masses noted. Liver, spleen and kidneys non palpable. Musculoskeletal: Strength 5/5 BUE/BLE. No difficulty with gait.  Neurological: Alert and oriented. Cranial nerves II-XII grossly intact. Coordination normal.  Psychiatric: Mood and affect normal. Behavior is normal. Judgment and thought content normal.     BMET    Component Value Date/Time   NA 135 08/30/2019 0457   K 5.2 (H) 08/30/2019 0457   CL 104 08/30/2019 0457   CO2 25 08/30/2019 0457   GLUCOSE 161 (H) 08/30/2019 0457   BUN 7 08/30/2019 0457   CREATININE 0.80 08/30/2019 0457   CALCIUM 8.5 (L) 08/30/2019 0457   GFRNONAA >60 08/30/2019 0457   GFRAA >60 08/30/2019 0457    Lipid Panel     Component Value Date/Time   CHOL 208 (H) 10/06/2018 0848   TRIG 149.0 10/06/2018 0848   HDL 48.90 10/06/2018 0848   CHOLHDL 4 10/06/2018 0848   VLDL 29.8  10/06/2018 0848   LDLCALC 130 (H) 10/06/2018 0848    CBC    Component Value Date/Time   WBC 11.0 (H) 08/30/2019 0457   RBC 4.11 08/30/2019 0457   HGB 12.4 08/30/2019 0457   HCT 38.4 08/30/2019 0457   PLT 157 08/30/2019 0457   MCV 93.4 08/30/2019 0457   MCH 30.2 08/30/2019 0457   MCHC 32.3 08/30/2019 0457   RDW 12.5 08/30/2019 0457   LYMPHSABS 0.9 08/30/2019 0457   MONOABS 0.6 08/30/2019 0457   EOSABS 0.0 08/30/2019 0457   BASOSABS 0.0 08/30/2019 0457    Hgb A1C Lab Results  Component Value Date   HGBA1C 5.2 10/06/2018           Assessment & Plan:   Preventative Health Maintenance:  Flu shot today Tetanus UTD Covid UTD Pap smear due 2023 Encouraged her to consume a balanced diet and exercise regimen Advised her to see an dentist annually Will check CBC, CMET, TSH, Lipid and A1C today  RTC in 1 year, sooner if needed Nicki Reaper, NP This visit occurred during the SARS-CoV-2 public health emergency.  Safety protocols were in place, including screening questions prior to the visit, additional usage of staff PPE, and extensive cleaning of exam room while observing appropriate contact time as indicated for disinfecting solutions.

## 2020-01-21 LAB — COMPREHENSIVE METABOLIC PANEL
AG Ratio: 1.8 (calc) (ref 1.0–2.5)
ALT: 7 U/L (ref 6–29)
AST: 12 U/L (ref 10–30)
Albumin: 4.3 g/dL (ref 3.6–5.1)
Alkaline phosphatase (APISO): 55 U/L (ref 31–125)
BUN: 12 mg/dL (ref 7–25)
CO2: 26 mmol/L (ref 20–32)
Calcium: 9.3 mg/dL (ref 8.6–10.2)
Chloride: 103 mmol/L (ref 98–110)
Creat: 0.72 mg/dL (ref 0.50–1.10)
Globulin: 2.4 g/dL (calc) (ref 1.9–3.7)
Glucose, Bld: 81 mg/dL (ref 65–99)
Potassium: 4.3 mmol/L (ref 3.5–5.3)
Sodium: 138 mmol/L (ref 135–146)
Total Bilirubin: 0.4 mg/dL (ref 0.2–1.2)
Total Protein: 6.7 g/dL (ref 6.1–8.1)

## 2020-01-21 LAB — CBC
HCT: 39.6 % (ref 35.0–45.0)
Hemoglobin: 13.3 g/dL (ref 11.7–15.5)
MCH: 31.4 pg (ref 27.0–33.0)
MCHC: 33.6 g/dL (ref 32.0–36.0)
MCV: 93.6 fL (ref 80.0–100.0)
Platelets: 135 10*3/uL — ABNORMAL LOW (ref 140–400)
RBC: 4.23 10*6/uL (ref 3.80–5.10)
RDW: 13.1 % (ref 11.0–15.0)
WBC: 6.7 10*3/uL (ref 3.8–10.8)

## 2020-01-21 LAB — LIPID PANEL
Cholesterol: 189 mg/dL (ref <200)
HDL: 45 mg/dL — ABNORMAL LOW (ref >=50)
LDL Cholesterol (Calc): 120 mg/dL (calc) — ABNORMAL HIGH
Non-HDL Cholesterol (Calc): 144 mg/dL (calc) — ABNORMAL HIGH (ref <130)
Total CHOL/HDL Ratio: 4.2 (calc) (ref <5.0)
Triglycerides: 129 mg/dL (ref <150)

## 2020-01-21 LAB — HEMOGLOBIN A1C
Hgb A1c MFr Bld: 4.8 % of total Hgb (ref <5.7)
Mean Plasma Glucose: 91 (calc)
eAG (mmol/L): 5 (calc)

## 2020-01-21 LAB — TSH: TSH: 1.35 mIU/L

## 2020-01-23 MED FILL — PANTOPRAZOLE SOD DR 20 MG T: 20 | 90 days supply | Qty: 90 | Fill #0

## 2020-01-23 MED FILL — SERTRALINE HCL 100 MG TABS: 100 | 90 days supply | Qty: 90 | Fill #0

## 2020-01-30 ENCOUNTER — Encounter: Payer: Self-pay | Admitting: Internal Medicine

## 2020-02-11 ENCOUNTER — Telehealth: Payer: No Typology Code available for payment source | Admitting: Emergency Medicine

## 2020-02-11 DIAGNOSIS — T7849XA Other allergy, initial encounter: Secondary | ICD-10-CM | POA: Diagnosis not present

## 2020-02-11 MED ORDER — PREDNISONE 10 MG (21) PO TBPK
ORAL_TABLET | ORAL | 0 refills | Status: DC
Start: 2020-02-11 — End: 2020-02-24

## 2020-02-11 NOTE — Progress Notes (Signed)
Time spent: 10 min  We are sorry that you are not feeling well. Here is how we plan to help!  Based on what you shared with me it looks like you have contact dermatitis.  Contact dermatitis is a skin rash caused by something that touches the skin and causes irritation or inflammation.  Your skin may be red, swollen, dry, cracked, and itch.  The rash should go away in a few days but can last a few weeks.  If you get a rash, it's important to figure out what caused it so the irritant can be avoided in the future. and I am prescribing a two week course of steroids (37 tablets of 10 mg prednisone).  Days 1-4 take 4 tablets (40 mg) daily  Days 5-8 take 3 tablets (30 mg) daily, Days 9-11 take 2 tablets (20 mg) daily, Days 12-14 take 1 tablet (10 mg) daily.   Additionally take benadryl 25-50 mg every 6 hours for itching.  Apply moisturizing lotion or ointment over skin to help with dryness. Other over the counter topical medicines that are helpful are calamine lotion, benadryl cream.   HOME CARE:   Take cool showers and avoid direct sunlight.  Apply cool compress or wet dressings.  Take a bath in an oatmeal bath.  Sprinkle content of one Aveeno packet under running faucet with comfortably warm water.  Bathe for 15-20 minutes, 1-2 times daily.  Pat dry with a towel. Do not rub the rash.  Use hydrocortisone cream.  Take an antihistamine like Benadryl for widespread rashes that itch.  The adult dose of Benadryl is 25-50 mg by mouth 4 times daily.  Caution:  This type of medication may cause sleepiness.  Do not drink alcohol, drive, or operate dangerous machinery while taking antihistamines.  Do not take these medications if you have prostate enlargement.  Read package instructions thoroughly on all medications that you take.  GET HELP RIGHT AWAY IF:   Symptoms don't go away after treatment.  Severe itching that persists.  If you rash spreads or swells.  If you rash begins to smell.  If it  blisters and opens or develops a yellow-brown crust.  You develop a fever.  You have a sore throat.  You become short of breath.  MAKE SURE YOU:  Understand these instructions. Will watch your condition. Will get help right away if you are not doing well or get worse.  Thank you for choosing an e-visit. Your e-visit answers were reviewed by a board certified advanced clinical practitioner to complete your personal care plan. Depending upon the condition, your plan could have included both over the counter or prescription medications. Please review your pharmacy choice. Be sure that the pharmacy you have chosen is open so that you can pick up your prescription now.  If there is a problem you may message your provider in MyChart to have the prescription routed to another pharmacy. Your safety is important to [place]. If you have drug allergies check your prescription carefully.  For the next 24 hours, you can use MyChart to ask questions about today's visit, request a non-urgent call back, or ask for a work or school excuse from your e-visit provider. You will get an email in the next two days asking about your experience. I hope that your e-visit has been valuable and will speed your recovery.

## 2020-02-23 ENCOUNTER — Encounter: Payer: Self-pay | Admitting: Internal Medicine

## 2020-02-23 DIAGNOSIS — G8929 Other chronic pain: Secondary | ICD-10-CM

## 2020-02-24 ENCOUNTER — Ambulatory Visit (INDEPENDENT_AMBULATORY_CARE_PROVIDER_SITE_OTHER): Payer: No Typology Code available for payment source | Admitting: Internal Medicine

## 2020-02-24 ENCOUNTER — Encounter: Payer: Self-pay | Admitting: Internal Medicine

## 2020-02-24 ENCOUNTER — Other Ambulatory Visit: Payer: Self-pay

## 2020-02-24 ENCOUNTER — Ambulatory Visit (INDEPENDENT_AMBULATORY_CARE_PROVIDER_SITE_OTHER)
Admission: RE | Admit: 2020-02-24 | Discharge: 2020-02-24 | Disposition: A | Discharge status: Home / Self Care | Payer: No Typology Code available for payment source | Source: Ambulatory Visit | Attending: Internal Medicine | Admitting: Internal Medicine

## 2020-02-24 VITALS — Temp 97.8°F | Wt 164.0 lb

## 2020-02-24 DIAGNOSIS — M25512 Pain in left shoulder: Secondary | ICD-10-CM

## 2020-02-24 DIAGNOSIS — G8929 Other chronic pain: Secondary | ICD-10-CM | POA: Diagnosis not present

## 2020-02-24 MED ORDER — CYCLOBENZAPRINE HCL 5 MG PO TABS
5.0000 mg | ORAL_TABLET | Freq: Three times a day (TID) | ORAL | 0 refills | Status: DC | PRN
Start: 2020-02-24 — End: 2021-01-25

## 2020-02-24 NOTE — Patient Instructions (Signed)
Shoulder Exercises Ask your health care provider which exercises are safe for you. Do exercises exactly as told by your health care provider and adjust them as directed. It is normal to feel mild stretching, pulling, tightness, or discomfort as you do these exercises. Stop right away if you feel sudden pain or your pain gets worse. Do not begin these exercises until told by your health care provider. Stretching exercises External rotation and abduction This exercise is sometimes called corner stretch. This exercise rotates your arm outward (external rotation) and moves your arm out from your body (abduction). 1. Stand in a doorway with one of your feet slightly in front of the other. This is called a staggered stance. If you cannot reach your forearms to the door frame, stand facing a corner of a room. 2. Choose one of the following positions as told by your health care provider: ? Place your hands and forearms on the door frame above your head. ? Place your hands and forearms on the door frame at the height of your head. ? Place your hands on the door frame at the height of your elbows. 3. Slowly move your weight onto your front foot until you feel a stretch across your chest and in the front of your shoulders. Keep your head and chest upright and keep your abdominal muscles tight. 4. Hold for __________ seconds. 5. To release the stretch, shift your weight to your back foot. Repeat __________ times. Complete this exercise __________ times a day. Extension, standing 1. Stand and hold a broomstick, a cane, or a similar object behind your back. ? Your hands should be a little wider than shoulder width apart. ? Your palms should face away from your back. 2. Keeping your elbows straight and your shoulder muscles relaxed, move the stick away from your body until you feel a stretch in your shoulders (extension). ? Avoid shrugging your shoulders while you move the stick. Keep your shoulder blades tucked  down toward the middle of your back. 3. Hold for __________ seconds. 4. Slowly return to the starting position. Repeat __________ times. Complete this exercise __________ times a day. Range-of-motion exercises Pendulum  1. Stand near a wall or a surface that you can hold onto for balance. 2. Bend at the waist and let your left / right arm hang straight down. Use your other arm to support you. Keep your back straight and do not lock your knees. 3. Relax your left / right arm and shoulder muscles, and move your hips and your trunk so your left / right arm swings freely. Your arm should swing because of the motion of your body, not because you are using your arm or shoulder muscles. 4. Keep moving your hips and trunk so your arm swings in the following directions, as told by your health care provider: ? Side to side. ? Forward and backward. ? In clockwise and counterclockwise circles. 5. Continue each motion for __________ seconds, or for as long as told by your health care provider. 6. Slowly return to the starting position. Repeat __________ times. Complete this exercise __________ times a day. Shoulder flexion, standing  1. Stand and hold a broomstick, a cane, or a similar object. Place your hands a little more than shoulder width apart on the object. Your left / right hand should be palm up, and your other hand should be palm down. 2. Keep your elbow straight and your shoulder muscles relaxed. Push the stick up with your healthy arm to   raise your left / right arm in front of your body, and then over your head until you feel a stretch in your shoulder (flexion). ? Avoid shrugging your shoulder while you raise your arm. Keep your shoulder blade tucked down toward the middle of your back. 3. Hold for __________ seconds. 4. Slowly return to the starting position. Repeat __________ times. Complete this exercise __________ times a day. Shoulder abduction, standing 1. Stand and hold a broomstick,  a cane, or a similar object. Place your hands a little more than shoulder width apart on the object. Your left / right hand should be palm up, and your other hand should be palm down. 2. Keep your elbow straight and your shoulder muscles relaxed. Push the object across your body toward your left / right side. Raise your left / right arm to the side of your body (abduction) until you feel a stretch in your shoulder. ? Do not raise your arm above shoulder height unless your health care provider tells you to do that. ? If directed, raise your arm over your head. ? Avoid shrugging your shoulder while you raise your arm. Keep your shoulder blade tucked down toward the middle of your back. 3. Hold for __________ seconds. 4. Slowly return to the starting position. Repeat __________ times. Complete this exercise __________ times a day. Internal rotation  1. Place your left / right hand behind your back, palm up. 2. Use your other hand to dangle an exercise band, a towel, or a similar object over your shoulder. Grasp the band with your left / right hand so you are holding on to both ends. 3. Gently pull up on the band until you feel a stretch in the front of your left / right shoulder. The movement of your arm toward the center of your body is called internal rotation. ? Avoid shrugging your shoulder while you raise your arm. Keep your shoulder blade tucked down toward the middle of your back. 4. Hold for __________ seconds. 5. Release the stretch by letting go of the band and lowering your hands. Repeat __________ times. Complete this exercise __________ times a day. Strengthening exercises External rotation  1. Sit in a stable chair without armrests. 2. Secure an exercise band to a stable object at elbow height on your left / right side. 3. Place a soft object, such as a folded towel or a small pillow, between your left / right upper arm and your body to move your elbow about 4 inches (10 cm) away  from your side. 4. Hold the end of the exercise band so it is tight and there is no slack. 5. Keeping your elbow pressed against the soft object, slowly move your forearm out, away from your abdomen (external rotation). Keep your body steady so only your forearm moves. 6. Hold for __________ seconds. 7. Slowly return to the starting position. Repeat __________ times. Complete this exercise __________ times a day. Shoulder abduction  1. Sit in a stable chair without armrests, or stand up. 2. Hold a __________ weight in your left / right hand, or hold an exercise band with both hands. 3. Start with your arms straight down and your left / right palm facing in, toward your body. 4. Slowly lift your left / right hand out to your side (abduction). Do not lift your hand above shoulder height unless your health care provider tells you that this is safe. ? Keep your arms straight. ? Avoid shrugging your shoulder while you   do this movement. Keep your shoulder blade tucked down toward the middle of your back. 5. Hold for __________ seconds. 6. Slowly lower your arm, and return to the starting position. Repeat __________ times. Complete this exercise __________ times a day. Shoulder extension 1. Sit in a stable chair without armrests, or stand up. 2. Secure an exercise band to a stable object in front of you so it is at shoulder height. 3. Hold one end of the exercise band in each hand. Your palms should face each other. 4. Straighten your elbows and lift your hands up to shoulder height. 5. Step back, away from the secured end of the exercise band, until the band is tight and there is no slack. 6. Squeeze your shoulder blades together as you pull your hands down to the sides of your thighs (extension). Stop when your hands are straight down by your sides. Do not let your hands go behind your body. 7. Hold for __________ seconds. 8. Slowly return to the starting position. Repeat __________ times.  Complete this exercise __________ times a day. Shoulder row 1. Sit in a stable chair without armrests, or stand up. 2. Secure an exercise band to a stable object in front of you so it is at waist height. 3. Hold one end of the exercise band in each hand. Position your palms so that your thumbs are facing the ceiling (neutral position). 4. Bend each of your elbows to a 90-degree angle (right angle) and keep your upper arms at your sides. 5. Step back until the band is tight and there is no slack. 6. Slowly pull your elbows back behind you. 7. Hold for __________ seconds. 8. Slowly return to the starting position. Repeat __________ times. Complete this exercise __________ times a day. Shoulder press-ups  1. Sit in a stable chair that has armrests. Sit upright, with your feet flat on the floor. 2. Put your hands on the armrests so your elbows are bent and your fingers are pointing forward. Your hands should be about even with the sides of your body. 3. Push down on the armrests and use your arms to lift yourself off the chair. Straighten your elbows and lift yourself up as much as you comfortably can. ? Move your shoulder blades down, and avoid letting your shoulders move up toward your ears. ? Keep your feet on the ground. As you get stronger, your feet should support less of your body weight as you lift yourself up. 4. Hold for __________ seconds. 5. Slowly lower yourself back into the chair. Repeat __________ times. Complete this exercise __________ times a day. Wall push-ups  1. Stand so you are facing a stable wall. Your feet should be about one arm-length away from the wall. 2. Lean forward and place your palms on the wall at shoulder height. 3. Keep your feet flat on the floor as you bend your elbows and lean forward toward the wall. 4. Hold for __________ seconds. 5. Straighten your elbows to push yourself back to the starting position. Repeat __________ times. Complete this exercise  __________ times a day. This information is not intended to replace advice given to you by your health care provider. Make sure you discuss any questions you have with your health care provider. Document Revised: 08/06/2018 Document Reviewed: 05/14/2018 Elsevier Patient Education  2020 [employer]  

## 2020-02-24 NOTE — Progress Notes (Signed)
Subjective:    Patient ID: Robin Little, female    DOB: 09-07-1985, 34 y.o.   MRN: [id number]  HPI  Pt presents to the clinic today with c/o left shoulder pain. This started 1-2 months ago. She describes the pain as achy with radiation down to her elbow. She denies numbness or tingling but has noticed weakness, especially when trying to lift her are above her head. She has tried Tylenol and Prednisone without any relief of symptoms. She reports history of a shoulder injury 10 years ago, partially torn rotator cuff- did PT but really improved with time.  Review of Systems  Past Medical History:  Diagnosis Date  . Anemia    thrombocytopenia was when pregnant  . Anxiety   . Depression   . GERD (gastroesophageal reflux disease)   . Headache    migraines  . History of shingles   . Hyperlipidemia   . IBS (irritable bowel syndrome)   . Obesity   . Pneumonia    as a kid    Current Outpatient Medications  Medication Sig Dispense Refill  . calcium carbonate (TUMS - DOSED IN MG ELEMENTAL CALCIUM) 500 MG chewable tablet Chew 1 tablet by mouth daily.    . Multiple Vitamins-Minerals (WOMENS MULTIVITAMIN PO) Take 1 tablet by mouth in the morning and at bedtime.    . pantoprazole (PROTONIX) 20 MG tablet Take 1 tablet (20 mg total) by mouth daily. 90 tablet 1  . predniSONE (STERAPRED UNI-PAK 21 TAB) 10 MG (21) TBPK tablet Take Taper as prescribed 21 tablet 0  . sertraline (ZOLOFT) 100 MG tablet Take 1 tablet (100 mg total) by mouth daily. 90 tablet 3   No current facility-administered medications for this visit.    No Known Allergies  Family History  Problem Relation Age of Onset  . Hyperlipidemia Mother   . Hypertension Mother   . Arthritis Mother   . Hypothyroidism Mother   . Hyperlipidemia Father   . Hypertension Father   . Cancer Maternal Grandmother        melanoma  . Goiter Maternal Grandmother   . Cancer Paternal Grandmother        liver  . Diabetes Maternal  Grandfather   . Parkinson's disease Maternal Grandfather   . Hyperlipidemia Maternal Grandfather   . Hypertension Maternal Grandfather   . Alcohol abuse Paternal Grandfather     Social History   Socioeconomic History  . Marital status: Married    Spouse name: Not on file  . Number of children: 2  . Years of education: Not on file  . Highest education level: Master's degree (e.g., MA, MS, MEng, MEd, MSW, MBA)  Occupational History  . Not on file  Tobacco Use  . Smoking status: Never Smoker  . Smokeless tobacco: Never Used  Vaping Use  . Vaping Use: Never used  Substance and Sexual Activity  . Alcohol use: Yes    Comment: social  . Drug use: No  . Sexual activity: Yes    Birth control/protection: None    Comment: Husband got vasectomy  Other Topics Concern  . Not on file  Social History Narrative  . Not on file   Social Determinants of Health   Financial Resource Strain:   . Difficulty of Paying Living Expenses: Not on file  Food Insecurity:   . Worried About Programme researcher, broadcasting/film/video in the Last Year: Not on file  . Ran Out of Food in the Last Year: Not on file  Transportation Needs:   . Freight forwarder (Medical): Not on file  . Lack of Transportation (Non-Medical): Not on file  Physical Activity:   . Days of Exercise per Week: Not on file  . Minutes of Exercise per Session: Not on file  Stress:   . Feeling of Stress : Not on file  Social Connections:   . Frequency of Communication with Friends and Family: Not on file  . Frequency of Social Gatherings with Friends and Family: Not on file  . Attends Religious Services: Not on file  . Active Member of Clubs or Organizations: Not on file  . Attends Banker Meetings: Not on file  . Marital Status: Not on file  Intimate Partner Violence:   . Fear of Current or Ex-Partner: Not on file  . Emotionally Abused: Not on file  . Physically Abused: Not on file  . Sexually Abused: Not on file      Constitutional: Denies fever, malaise, fatigue, headache or abrupt weight changes.  Respiratory: Denies difficulty breathing, shortness of breath, cough or sputum production.   Cardiovascular: Denies chest pain, chest tightness, palpitations or swelling in the hands or feet.  Musculoskeletal: Pt reports left shoulder pain. Denies decrease in range of motion, difficulty with gait, muscle pain or joint swelling.  Neurological: Denies numbness, tingling or problems with balance and coordination.    No other specific complaints in a complete review of systems (except as listed in HPI above).     Objective:   Physical Exam  Temp 97.8 F (36.6 C) (Temporal)   Wt 164 lb (74.4 kg)   LMP 02/19/2020   BMI 26.88 kg/m   Wt Readings from Last 3 Encounters:  01/20/20 170 lb (77.1 kg)  11/01/19 189 lb 4.8 oz (85.9 kg)  09/14/19 206 lb 8 oz (93.7 kg)    General: Appears her stated age, well developed, well nourished in NAD. Cardiovascular: Normal rate. Pulmonary/Chest: Normal effort. Musculoskeletal: Normal internal and external rotation of the left shoulder. Positive drop can test on the left. Pain with palpation over the anterior shoulder. Strength 5/5 BUE. Neurological: Alert and oriented. Cranial nerves II-XII grossly intact. Coordination normal.    BMET    Component Value Date/Time   NA 138 01/20/2020 1549   K 4.3 01/20/2020 1549   CL 103 01/20/2020 1549   CO2 26 01/20/2020 1549   GLUCOSE 81 01/20/2020 1549   BUN 12 01/20/2020 1549   CREATININE 0.72 01/20/2020 1549   CALCIUM 9.3 01/20/2020 1549   GFRNONAA >60 08/30/2019 0457   GFRAA >60 08/30/2019 0457    Lipid Panel     Component Value Date/Time   CHOL 189 01/20/2020 1549   TRIG 129 01/20/2020 1549   HDL 45 (L) 01/20/2020 1549   CHOLHDL 4.2 01/20/2020 1549   VLDL 29.8 10/06/2018 0848   LDLCALC 120 (H) 01/20/2020 1549    CBC    Component Value Date/Time   WBC 6.7 01/20/2020 1549   RBC 4.23 01/20/2020 1549    HGB 13.3 01/20/2020 1549   HCT 39.6 01/20/2020 1549   PLT 135 (L) 01/20/2020 1549   MCV 93.6 01/20/2020 1549   MCH 31.4 01/20/2020 1549   MCHC 33.6 01/20/2020 1549   RDW 13.1 01/20/2020 1549   LYMPHSABS 0.9 08/30/2019 0457   MONOABS 0.6 08/30/2019 0457   EOSABS 0.0 08/30/2019 0457   BASOSABS 0.0 08/30/2019 0457    Hgb A1C Lab Results  Component Value Date   HGBA1C 4.8 01/20/2020  Assessment & Plan:   Left Shoulder Pain:  Xray today No improvement with steroids RX for Flexeril 5 mg TID prn- sedation caution given Encouraged rest and ice Consider PT vs referral to ortho pending xray results and symptoms after the weekend  Will follow up after xray, return precautions discussed  Nicki Reaper, NP This visit occurred during the SARS-CoV-2 public health emergency.  Safety protocols were in place, including screening questions prior to the visit, additional usage of staff PPE, and extensive cleaning of exam room while observing appropriate contact time as indicated for disinfecting solutions.

## 2020-02-25 ENCOUNTER — Encounter: Payer: Self-pay | Admitting: Internal Medicine

## 2020-02-28 MED ORDER — TRAMADOL HCL 50 MG PO TABS: 50.0000 mg | ORAL_TABLET | Freq: Three times a day (TID) | ORAL | 0 refills | Status: AC | PRN

## 2020-02-28 NOTE — Addendum Note (Signed)
Addended by: Lorre Munroe on: 02/28/2020 11:04 AM   Modules accepted: Orders

## 2020-02-29 ENCOUNTER — Encounter: Payer: Self-pay | Admitting: Internal Medicine

## 2020-02-29 DIAGNOSIS — M25512 Pain in left shoulder: Secondary | ICD-10-CM

## 2020-02-29 DIAGNOSIS — G8929 Other chronic pain: Secondary | ICD-10-CM

## 2020-03-01 NOTE — Telephone Encounter (Signed)
Cancelled and resubmitted to [facility] - Dr Farris Has.  Records faxed

## 2020-03-06 ENCOUNTER — Encounter: Payer: No Typology Code available for payment source | Attending: General Surgery | Admitting: Skilled Nursing Facility1

## 2020-03-06 ENCOUNTER — Other Ambulatory Visit: Payer: Self-pay

## 2020-03-06 DIAGNOSIS — E669 Obesity, unspecified: Secondary | ICD-10-CM | POA: Insufficient documentation

## 2020-03-07 NOTE — Progress Notes (Signed)
Follow-up visit:  Post-Operative Sleeve Surgery  Medical Nutrition Therapy:  Appt start time: 6:00pm end time:  7:00pm  Primary concerns today: Post-operative Bariatric Surgery Nutrition Management 6 Month Post-Op Class 2 Months Post-Operative Sleeve Gastrectomy Surgery Surgery Date: 08/29/2019 Anthropometrics  Start weight at [facility]: 220 lbs (date: 02/10/2020) Today's weight: 163.3 lbs  Body Composition Scale 09/13/2019 11/01/2019 03/06/2020  Weight  lbs 206.5 189.3 163.3  BMI 34.4 31.2 26.9  Total Body Fat  % 39.3 36.5 31.4     Visceral Fat 9 8 6   Fat-Free Mass  % 60.6 63.4 68.5     Total Body Water  % 44.8 46.2 48.7     Muscle-Mass  lbs 32.3 32.2 31.9  Body Fat Displacement --- ---          Torso  lbs 50.2 42.7 31.7         Left Leg  lbs 10 8.5 6.3         Right Leg  lbs 10 8.5 6.3         Left Arm  lbs 5 4.2 3.1         Right Arm  lbs 5 4.2 3.1   Information Reviewed/ Discussed During Appointment: -Review of composition scale numbers -Fluid requirements (64-100 ounces) -Protein requirements (60-80g) -Strategies for tolerating diet -Advancement of diet to include Starchy vegetables -Barriers to inclusion of new foods -Inclusion of appropriate multivitamin and calcium supplements  -Exercise recommendations   Fluid intake: adequate   Medications: See List Supplementation: appropriate    Using straws: no Drinking while eating: no Having you been chewing well: yes Chewing/swallowing difficulties: no Changes in vision: no Changes to mood/headaches: no Hair loss/Cahnges to skin/Changes to nails: no Any difficulty focusing or concentrating: no Sweating: no Dizziness/Lightheaded: no Palpitations: no  Carbonated beverages: no N/V/D/C/GAS: no Abdominal Pain: no Dumping syndrome: no  Recent physical activity:  ADL's  Progress Towards Goal(s):  In Progress  Handouts given during visit include:  Phase V diet Progression   Goals Sheet  The Benefits of Exercise are  endless.....  Support Group Topics  Pt Chosen Goals:  Mental Health and Well Being: I will say 2 nice things to and about myself 7 days a week by (specific date) _2/8/21_____________  Physical Activity Goals: I will (type of movement) __ride my bike______ for (hours/minutes) __30 min__, for (how many days a week) __4days/week_____ by (specific date) ______2/8/21__________   Teaching Method Utilized:  Visual Auditory Hands on  Demonstrated degree of understanding via:  Teach Back   Monitoring/Evaluation:  Dietary intake, exercise, and body weight. Follow up in 3 months for 9 month post-op visit.

## 2020-06-05 ENCOUNTER — Ambulatory Visit: Payer: No Typology Code available for payment source | Admitting: Dietician

## 2020-06-18 ENCOUNTER — Other Ambulatory Visit: Payer: Self-pay

## 2020-06-18 ENCOUNTER — Encounter: Payer: No Typology Code available for payment source | Attending: General Surgery | Admitting: Skilled Nursing Facility1

## 2020-06-18 DIAGNOSIS — E669 Obesity, unspecified: Secondary | ICD-10-CM | POA: Diagnosis present

## 2020-06-18 NOTE — Progress Notes (Signed)
Follow-up visit:  Post-Operative Sleeve Surgery  Surgery Date: 08/29/2019 Anthropometrics  Start weight at [facility]: 220 lbs (date: 02/10/2020) Today's weight: 155.8 lbs  Body Composition Scale 09/13/2019 11/01/2019 03/06/2020 06/18/2020  Weight  lbs 206.5 189.3 163.3 155.8  BMI 34.4 31.2 26.9 25.6  Total Body Fat  % 39.3 36.5 31.4 28.4     Visceral Fat 9 8 6 6   Fat-Free Mass  % 60.6 63.4 68.5 71.5     Total Body Water  % 44.8 46.2 48.7 50.2     Muscle-Mass  lbs 32.3 32.2 31.9 32.7  Body Fat Displacement --- ---           Torso  lbs 50.2 42.7 31.7 27.3         Left Leg  lbs 10 8.5 6.3 5.4         Right Leg  lbs 10 8.5 6.3 5.4         Left Arm  lbs 5 4.2 3.1 2.7         Right Arm  lbs 5 4.2 3.1 2.7   Pt states she has had some hair regrowth. Pt states she weighs 3 times a week. Pt states she is worried about adding in complex carbohydrates.   24 hr recall: Breakfast: protein shake + coffee Snack: Lunch: low carb frozen meal  Snack: 2 cheese sticks Dinner: meat + greens + 1 tater tot Snack  Beverages: water  Fluid intake: 60 oz  Medications: See List Supplementation: appropriate    Using straws: no Drinking while eating: no Having you been chewing well: yes Chewing/swallowing difficulties: no Changes in vision: no Changes to mood/headaches: no Hair loss/Cahnges to skin/Changes to nails: no Any difficulty focusing or concentrating: no Sweating: no Dizziness/Lightheaded: no Palpitations: no  Carbonated beverages: no N/V/D/C/GAS: no Abdominal Pain: no Dumping syndrome: no  Recent physical activity:  [place]: 2 nights a week; resistance bands sometimes   Encouraged patient to honor their body's internal hunger and fullness cues.  Throughout the day, check in mentally and rate hunger. Stop eating when satisfied not full regardless of how much food is left on the plate.  Get more if still hungry 20-30 minutes later.  The key is to honor satisfaction so throughout the meal,  rate fullness factor and stop when comfortably satisfied not physically full. The key is to honor hunger and fullness without any feelings of guilt or shame.  Pay attention to what the internal cues are, rather than any external factors. This will enhance the confidence you have in listening to your own body and following those internal cues enabling you to increase how often you eat when you are hungry not out of appetite and stop when you are satisfied not full.  Encouraged pt to continue to eat balanced meals inclusive of non starchy vegetables 2 times a day 7 days a week Encouraged pt to choose lean protein sources: limiting beef, pork, sausage, hotdogs, and lunch meat Encourage pt to choose healthy fats such as plant based limiting animal fats Encouraged pt to continue to drink a minium 64 fluid ounces with half being plain water to satisfy proper hydration    Goals: Aim for 50 grams of complex carbohydrates per day  Teaching Method Utilized:  Visual Auditory Hands on  Demonstrated degree of understanding via:  Teach Back   Monitoring/Evaluation:  Dietary intake, exercise, and body weight. Follow up in 3 months

## 2020-07-19 ENCOUNTER — Other Ambulatory Visit (HOSPITAL_BASED_OUTPATIENT_CLINIC_OR_DEPARTMENT_OTHER): Payer: Self-pay

## 2020-09-04 IMAGING — DX DG FINGER THUMB 2+V*L*
3 series · 3 of 3 positions shown · non-contrast
Comparison: None.

CLINICAL DATA: 32-year-old who slipped off the bed earlier today
and injured the LEFT thumb. Initial encounter.

EXAM:
LEFT THUMB 2+V

[finger ap]
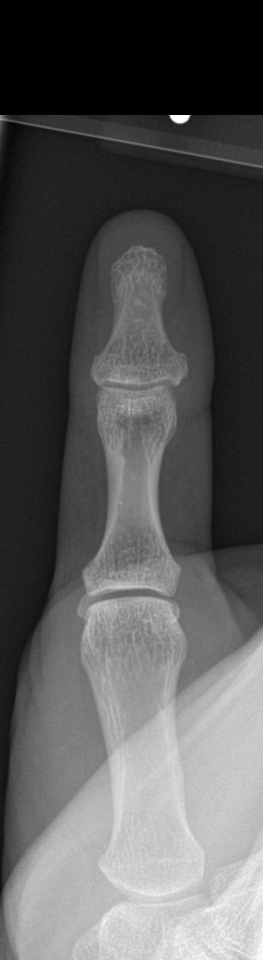

[finger obl]
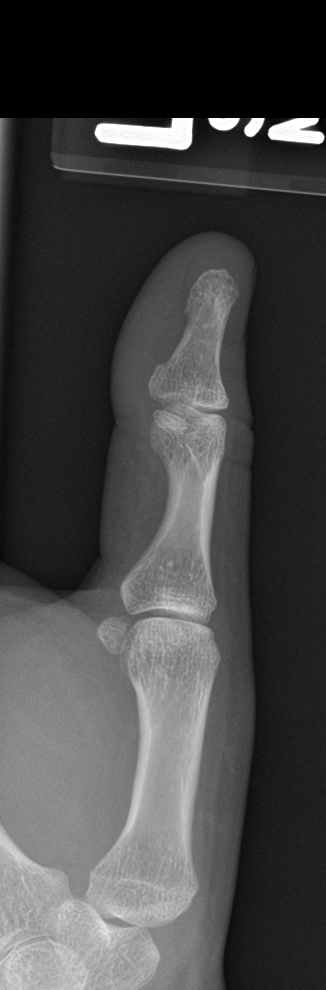

[finger lat]
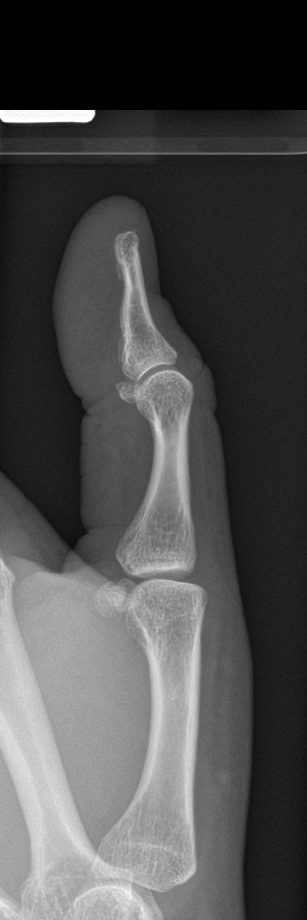

[3 of 3 positions shown; findings below may reference images not displayed]

FINDINGS: No evidence of acute fracture or dislocation. Joint spaces well
preserved. Well-preserved bone mineral density. No intrinsic osseous
abnormalities.
IMPRESSION: Normal examination.

## 2020-09-18 ENCOUNTER — Other Ambulatory Visit (HOSPITAL_COMMUNITY): Payer: Self-pay

## 2020-09-18 ENCOUNTER — Other Ambulatory Visit: Payer: Self-pay | Admitting: Internal Medicine

## 2020-09-18 DIAGNOSIS — K219 Gastro-esophageal reflux disease without esophagitis: Secondary | ICD-10-CM

## 2020-09-18 MED ORDER — PANTOPRAZOLE SODIUM 20 MG PO TBEC
DELAYED_RELEASE_TABLET | Freq: Every day | ORAL | 1 refills | Status: DC
  Filled 2020-09-18: qty 90, 90d supply, fill #0
  Filled 2021-01-22: qty 90, 90d supply, fill #1

## 2020-09-18 MED FILL — Sertraline HCl Tab 100 MG: ORAL | 90 days supply | Qty: 90 | Fill #0 | Status: AC

## 2020-09-18 NOTE — Telephone Encounter (Signed)
Pt called in to return call, she confirmed that she would like to continue with Nicki Reaper NP-C, and also informed her about the medication turn around time.

## 2020-09-18 NOTE — Telephone Encounter (Signed)
Refill request for Pantoprazole last refilled 05/20/20; pt previously seen by Lanny Cramp, [facility]; attempted to notify pt the provider is at [facility] and to determine which practice she would like to be seen inl; left message for her to contact office; will route request to [facility] for refill.

## 2020-10-02 ENCOUNTER — Ambulatory Visit: Payer: No Typology Code available for payment source | Admitting: Skilled Nursing Facility1

## 2021-01-22 ENCOUNTER — Other Ambulatory Visit: Payer: Self-pay | Admitting: Internal Medicine

## 2021-01-22 ENCOUNTER — Other Ambulatory Visit (HOSPITAL_COMMUNITY): Payer: Self-pay

## 2021-01-22 NOTE — Telephone Encounter (Signed)
Requested medication (s) are due for refill today: yes  Requested medication (s) are on the active medication list: expired  Last refill:  01/20/20  Future visit scheduled: yes  Notes to clinic:  Med expired 01/19/21   Requested Prescriptions  Pending Prescriptions Disp Refills   sertraline (ZOLOFT) 100 MG tablet 90 tablet 3    Sig: TAKE 1 TABLET BY MOUTH ONCE DAILY     Psychiatry:  Antidepressants - SSRI Failed - 01/22/2021  2:07 PM      Failed - Valid encounter within last 6 months    Recent Outpatient Visits   None     Future Appointments             In 3 days Baity, Salvadore Oxford, NP [facility], [facility]

## 2021-01-23 ENCOUNTER — Other Ambulatory Visit (HOSPITAL_COMMUNITY): Payer: Self-pay

## 2021-01-23 MED FILL — Sertraline HCl Tab 100 MG: ORAL | 90 days supply | Qty: 90 | Fill #0 | Status: AC

## 2021-01-23 NOTE — Telephone Encounter (Signed)
Requested medication (s) are due for refill today:   Yes  Requested medication (s) are on the active medication list:   Yes  Future visit scheduled:   Yes 9/30 with Baity   Last ordered: 01/20/2020 #90, 3 refills  Returned because Rx is expired.   Requested Prescriptions  Pending Prescriptions Disp Refills   sertraline (ZOLOFT) 100 MG tablet 90 tablet 3    Sig: TAKE 1 TABLET BY MOUTH ONCE DAILY     Psychiatry:  Antidepressants - SSRI Failed - 01/23/2021 12:09 AM      Failed - Valid encounter within last 6 months    Recent Outpatient Visits   None     Future Appointments             In 2 days Baity, Salvadore Oxford, NP [facility], [facility]

## 2021-01-25 ENCOUNTER — Encounter: Payer: Self-pay | Admitting: Internal Medicine

## 2021-01-25 ENCOUNTER — Other Ambulatory Visit: Payer: Self-pay

## 2021-01-25 ENCOUNTER — Ambulatory Visit (INDEPENDENT_AMBULATORY_CARE_PROVIDER_SITE_OTHER): Payer: No Typology Code available for payment source | Admitting: Internal Medicine

## 2021-01-25 VITALS — BP 102/65 | HR 66 | Temp 98.6°F | Resp 17 | Ht 65.5 in | Wt 158.4 lb

## 2021-01-25 DIAGNOSIS — Z6825 Body mass index (BMI) 25.0-25.9, adult: Secondary | ICD-10-CM

## 2021-01-25 DIAGNOSIS — F419 Anxiety disorder, unspecified: Secondary | ICD-10-CM

## 2021-01-25 DIAGNOSIS — Z0001 Encounter for general adult medical examination with abnormal findings: Secondary | ICD-10-CM

## 2021-01-25 DIAGNOSIS — Z1159 Encounter for screening for other viral diseases: Secondary | ICD-10-CM

## 2021-01-25 DIAGNOSIS — E663 Overweight: Secondary | ICD-10-CM | POA: Diagnosis not present

## 2021-01-25 DIAGNOSIS — K219 Gastro-esophageal reflux disease without esophagitis: Secondary | ICD-10-CM | POA: Diagnosis not present

## 2021-01-25 DIAGNOSIS — R519 Headache, unspecified: Secondary | ICD-10-CM

## 2021-01-25 NOTE — Assessment & Plan Note (Signed)
Continue Pantoprazole.

## 2021-01-25 NOTE — Assessment & Plan Note (Signed)
Continue Sertraline Support offered 

## 2021-01-25 NOTE — Progress Notes (Signed)
Subjective:    Patient ID: Robin Little, female    DOB: [birth year], 35 y.o.   MRN: [id number]  HPI  Patient presents the clinic today for annual exam.  She is also due to follow-up chronic conditions.  GERD: Intermittent.  She is taking Pantoprazole status post gastric sleeve.  There is no upper GI on file.  Anxiety: Persistent, stable on Sertraline.  She is not currently seeing a therapist.  She denies depression, SI/HI.  Frequent Headaches: Triggered by stress.  These rarely occur now.  She is not taking any medication for this.  Flu: 12/2020 Tetanus: 06/2014 COVID: [place] x3 Pap smear: 12/2016, [facility] Dentist: As needed  Diet: She does eat meat. She consumes fruits and veggies. She tries to avoid fried foods. She drinks mostly water. Exercise: None  Review of Systems     Past Medical History:  Diagnosis Date   Anemia    thrombocytopenia was when pregnant   Anxiety    Depression    GERD (gastroesophageal reflux disease)    Headache    migraines   History of shingles    Hyperlipidemia    IBS (irritable bowel syndrome)    Obesity    Pneumonia    as a kid    Current Outpatient Medications  Medication Sig Dispense Refill   calcium carbonate (TUMS - DOSED IN MG ELEMENTAL CALCIUM) 500 MG chewable tablet Chew 1 tablet by mouth daily.     cyclobenzaprine (FLEXERIL) 5 MG tablet Take 1 tablet (5 mg total) by mouth 3 (three) times daily as needed for muscle spasms. 30 tablet 0   Multiple Vitamins-Minerals (WOMENS MULTIVITAMIN PO) Take 1 tablet by mouth in the morning and at bedtime.     pantoprazole (PROTONIX) 20 MG tablet TAKE 1 TABLET BY MOUTH ONCE DAILY 90 tablet 1   sertraline (ZOLOFT) 100 MG tablet TAKE 1 TABLET BY MOUTH ONCE DAILY 90 tablet 3   No current facility-administered medications for this visit.    No Known Allergies  Family History  Problem Relation Age of Onset   Hyperlipidemia Mother    Hypertension Mother    Arthritis Mother     Hypothyroidism Mother    Hyperlipidemia Father    Hypertension Father    Cancer Maternal Grandmother        melanoma   Goiter Maternal Grandmother    Cancer Paternal Grandmother        liver   Diabetes Maternal Grandfather    Parkinson's disease Maternal Grandfather    Hyperlipidemia Maternal Grandfather    Hypertension Maternal Grandfather    Alcohol abuse Paternal Grandfather     Social History   Socioeconomic History   Marital status: Married    Spouse name: Not on file   Number of children: 2   Years of education: Not on file   Highest education level: Master's degree (e.g., MA, MS, MEng, MEd, MSW, MBA)  Occupational History   Not on file  Tobacco Use   Smoking status: Never   Smokeless tobacco: Never  Vaping Use   Vaping Use: Never used  Substance and Sexual Activity   Alcohol use: Yes    Comment: social   Drug use: No   Sexual activity: Yes    Birth control/protection: None    Comment: Husband got vasectomy  Other Topics Concern   Not on file  Social History Narrative   Not on file   Social Determinants of Health   Financial Resource Strain: Not on  file  Food Insecurity: Not on file  Transportation Needs: Not on file  Physical Activity: Not on file  Stress: Not on file  Social Connections: Not on file  Intimate Partner Violence: Not on file     Constitutional: Patient reports intermittent headaches.  Denies fever, malaise, fatigue,  or abrupt weight changes.  HEENT: Denies eye pain, eye redness, ear pain, ringing in the ears, wax buildup, runny nose, nasal congestion, bloody nose, or sore throat. Respiratory: Denies difficulty breathing, shortness of breath, cough or sputum production.   Cardiovascular: Denies chest pain, chest tightness, palpitations or swelling in the hands or feet.  Gastrointestinal: Patient reports intermittent reflux.  Denies abdominal pain, bloating, constipation, diarrhea or blood in the stool.  GU: Denies urgency, frequency,  pain with urination, burning sensation, blood in urine, odor or discharge. Musculoskeletal: Denies decrease in range of motion, difficulty with gait, muscle pain or joint pain and swelling.  Skin: Denies redness, rashes, lesions or ulcercations.  Neurological: Denies dizziness, difficulty with memory, difficulty with speech or problems with balance and coordination.  Psych: Patient has a history of anxiety.  Denies depression, SI/HI.  No other specific complaints in a complete review of systems (except as listed in HPI above).  Objective:   Physical Exam  BP 102/65 (BP Location: Right Arm, Patient Position: Sitting, Cuff Size: Normal)   Pulse 66   Temp 98.6 F (37 C) (Temporal)   Resp 17   Ht 5' 5.5" (1.664 m)   Wt 158 lb 6.4 oz (71.8 kg)   SpO2 100%   BMI 25.96 kg/m   Wt Readings from Last 3 Encounters:  06/18/20 155 lb 12.8 oz (70.7 kg)  03/07/20 163 lb 9.6 oz (74.2 kg)  02/24/20 164 lb (74.4 kg)    General: Appears her stated age, overweight in NAD. Skin: Warm, dry and intact. No rashes, lesions or ulcerations noted. HEENT: Head: normal shape and size; Eyes: sclera white and EOMs intact;  Neck:  Neck supple, trachea midline. No masses, lumps or thyromegaly present.  Cardiovascular: Normal rate and rhythm. S1,S2 noted.  No murmur, rubs or gallops noted. No JVD or BLE edema.  Pulmonary/Chest: Normal effort and positive vesicular breath sounds. No respiratory distress. No wheezes, rales or ronchi noted.  Abdomen: Soft and nontender. Normal bowel sounds. No distention or masses noted. Liver, spleen and kidneys non palpable. Musculoskeletal: Strength 5/5 BUE/BLE. No difficulty with gait.  Neurological: Alert and oriented. Cranial nerves II-XII grossly intact. Coordination normal.  Psychiatric: Mood and affect normal. Behavior is normal. Judgment and thought content normal.    BMET    Component Value Date/Time   NA 138 01/20/2020 1549   K 4.3 01/20/2020 1549   CL 103  01/20/2020 1549   CO2 26 01/20/2020 1549   GLUCOSE 81 01/20/2020 1549   BUN 12 01/20/2020 1549   CREATININE 0.72 01/20/2020 1549   CALCIUM 9.3 01/20/2020 1549   GFRNONAA >60 08/30/2019 0457   GFRAA >60 08/30/2019 0457    Lipid Panel     Component Value Date/Time   CHOL 189 01/20/2020 1549   TRIG 129 01/20/2020 1549   HDL 45 (L) 01/20/2020 1549   CHOLHDL 4.2 01/20/2020 1549   VLDL 29.8 10/06/2018 0848   LDLCALC 120 (H) 01/20/2020 1549    CBC    Component Value Date/Time   WBC 6.7 01/20/2020 1549   RBC 4.23 01/20/2020 1549   HGB 13.3 01/20/2020 1549   HCT 39.6 01/20/2020 1549   PLT 135 (L)  01/20/2020 1549   MCV 93.6 01/20/2020 1549   MCH 31.4 01/20/2020 1549   MCHC 33.6 01/20/2020 1549   RDW 13.1 01/20/2020 1549   LYMPHSABS 0.9 08/30/2019 0457   MONOABS 0.6 08/30/2019 0457   EOSABS 0.0 08/30/2019 0457   BASOSABS 0.0 08/30/2019 0457    Hgb A1C Lab Results  Component Value Date   HGBA1C 4.8 01/20/2020           Assessment & Plan:   Preventative Health Maintenance:  Flu shot UTD Tetanus UTD Encouraged her to get her COVID booster Pap smear due 2023 Encouraged her to consume a balanced diet and exercise regimen Advised her to see a dentist annually We will check CBC, c-Met, lipid, A1c and hep C today  RTC in 1 year, sooner if needed  Webb Silversmith, NP This visit occurred during the SARS-CoV-2 public health emergency.  Safety protocols were in place, including screening questions prior to the visit, additional usage of staff PPE, and extensive cleaning of exam room while observing appropriate contact time as indicated for disinfecting solutions.

## 2021-01-25 NOTE — Assessment & Plan Note (Signed)
Avoid triggers 

## 2021-01-25 NOTE — Assessment & Plan Note (Signed)
Encourage diet and exercise for weight loss 

## 2021-01-25 NOTE — Patient Instructions (Signed)
Health Maintenance, Female Adopting a healthy lifestyle and getting preventive care are important in promoting health and wellness. Ask your health care provider about: The right schedule for you to have regular tests and exams. Things you can do on your own to prevent diseases and keep yourself healthy. What should I know about diet, weight, and exercise? Eat a healthy diet  Eat a diet that includes plenty of vegetables, fruits, low-fat dairy products, and lean protein. Do not eat a lot of foods that are high in solid fats, added sugars, or sodium. Maintain a healthy weight Body mass index (BMI) is used to identify weight problems. It estimates body fat based on height and weight. Your health care provider can help determine your BMI and help you achieve or maintain a healthy weight. Get regular exercise Get regular exercise. This is one of the most important things you can do for your health. Most adults should: Exercise for at least 150 minutes each week. The exercise should increase your heart rate and make you sweat (moderate-intensity exercise). Do strengthening exercises at least twice a week. This is in addition to the moderate-intensity exercise. Spend less time sitting. Even light physical activity can be beneficial. Watch cholesterol and blood lipids Have your blood tested for lipids and cholesterol at 36 years of age, then have this test every 5 years. Have your cholesterol levels checked more often if: Your lipid or cholesterol levels are high. You are older than 36 years of age. You are at high risk for heart disease. What should I know about cancer screening? Depending on your health history and family history, you may need to have cancer screening at various ages. This may include screening for: Breast cancer. Cervical cancer. Colorectal cancer. Skin cancer. Lung cancer. What should I know about heart disease, diabetes, and high blood pressure? Blood pressure and heart  disease High blood pressure causes heart disease and increases the risk of stroke. This is more likely to develop in people who have high blood pressure readings, are of African descent, or are overweight. Have your blood pressure checked: Every 3-5 years if you are 18-39 years of age. Every year if you are 40 years old or older. Diabetes Have regular diabetes screenings. This checks your fasting blood sugar level. Have the screening done: Once every three years after age 40 if you are at a normal weight and have a low risk for diabetes. More often and at a younger age if you are overweight or have a high risk for diabetes. What should I know about preventing infection? Hepatitis B If you have a higher risk for hepatitis B, you should be screened for this virus. Talk with your health care provider to find out if you are at risk for hepatitis B infection. Hepatitis C Testing is recommended for: Everyone born from 1945 through 1965. Anyone with known risk factors for hepatitis C. Sexually transmitted infections (STIs) Get screened for STIs, including gonorrhea and chlamydia, if: You are sexually active and are younger than 36 years of age. You are older than 36 years of age and your health care provider tells you that you are at risk for this type of infection. Your sexual activity has changed since you were last screened, and you are at increased risk for chlamydia or gonorrhea. Ask your health care provider if you are at risk. Ask your health care provider about whether you are at high risk for HIV. Your health care provider may recommend a prescription medicine   to help prevent HIV infection. If you choose to take medicine to prevent HIV, you should first get tested for HIV. You should then be tested every 3 months for as long as you are taking the medicine. Pregnancy If you are about to stop having your period (premenopausal) and you may become pregnant, seek counseling before you get  pregnant. Take 400 to 800 micrograms (mcg) of folic acid every day if you become pregnant. Ask for birth control (contraception) if you want to prevent pregnancy. Osteoporosis and menopause Osteoporosis is a disease in which the bones lose minerals and strength with aging. This can result in bone fractures. If you are 65 years old or older, or if you are at risk for osteoporosis and fractures, ask your health care provider if you should: Be screened for bone loss. Take a calcium or vitamin D supplement to lower your risk of fractures. Be given hormone replacement therapy (HRT) to treat symptoms of menopause. Follow these instructions at home: Lifestyle Do not use any products that contain nicotine or tobacco, such as cigarettes, e-cigarettes, and chewing tobacco. If you need help quitting, ask your health care provider. Do not use street drugs. Do not share needles. Ask your health care provider for help if you need support or information about quitting drugs. Alcohol use Do not drink alcohol if: Your health care provider tells you not to drink. You are pregnant, may be pregnant, or are planning to become pregnant. If you drink alcohol: Limit how much you use to 0-1 drink a day. Limit intake if you are breastfeeding. Be aware of how much alcohol is in your drink. In the [place] one drink equals one 12 oz bottle of beer (355 mL), one 5 oz glass of wine (148 mL), or one 1 oz glass of hard liquor (44 mL). General instructions Schedule regular health, dental, and eye exams. Stay current with your vaccines. Tell your health care provider if: You often feel depressed. You have ever been abused or do not feel safe at home. Summary Adopting a healthy lifestyle and getting preventive care are important in promoting health and wellness. Follow your health care provider's instructions about healthy diet, exercising, and getting tested or screened for diseases. Follow your health care provider's  instructions on monitoring your cholesterol and blood pressure. This information is not intended to replace advice given to you by your health care provider. Make sure you discuss any questions you have with your health care provider. Document Revised: 06/22/2020 Document Reviewed: 04/07/2018 Elsevier Patient Education  2022 Elsevier Inc.  

## 2021-01-28 LAB — CBC
HCT: 41 % (ref 35.0–45.0)
Hemoglobin: 13.8 g/dL (ref 11.7–15.5)
MCH: 31.4 pg (ref 27.0–33.0)
MCHC: 33.7 g/dL (ref 32.0–36.0)
MCV: 93.4 fL (ref 80.0–100.0)
MPV: 14.7 fL — ABNORMAL HIGH (ref 7.5–12.5)
Platelets: 155 10*3/uL (ref 140–400)
RBC: 4.39 10*6/uL (ref 3.80–5.10)
RDW: 12.1 % (ref 11.0–15.0)
WBC: 6.3 10*3/uL (ref 3.8–10.8)

## 2021-01-28 LAB — COMPLETE METABOLIC PANEL WITH GFR
AG Ratio: 1.7 (calc) (ref 1.0–2.5)
ALT: 10 U/L (ref 6–29)
AST: 14 U/L (ref 10–30)
Albumin: 4.4 g/dL (ref 3.6–5.1)
Alkaline phosphatase (APISO): 58 U/L (ref 31–125)
BUN: 17 mg/dL (ref 7–25)
CO2: 28 mmol/L (ref 20–32)
Calcium: 9.2 mg/dL (ref 8.6–10.2)
Chloride: 101 mmol/L (ref 98–110)
Creat: 0.68 mg/dL (ref 0.50–0.97)
Globulin: 2.6 g/dL (calc) (ref 1.9–3.7)
Glucose, Bld: 78 mg/dL (ref 65–139)
Potassium: 4.5 mmol/L (ref 3.5–5.3)
Sodium: 138 mmol/L (ref 135–146)
Total Bilirubin: 0.4 mg/dL (ref 0.2–1.2)
Total Protein: 7 g/dL (ref 6.1–8.1)
eGFR: 116 mL/min/{1.73_m2} (ref >=60)

## 2021-01-28 LAB — LIPID PANEL
Cholesterol: 204 mg/dL — ABNORMAL HIGH (ref <200)
HDL: 60 mg/dL (ref >=50)
LDL Cholesterol (Calc): 116 mg/dL (calc) — ABNORMAL HIGH
Non-HDL Cholesterol (Calc): 144 mg/dL (calc) — ABNORMAL HIGH (ref <130)
Total CHOL/HDL Ratio: 3.4 (calc) (ref <5.0)
Triglycerides: 165 mg/dL — ABNORMAL HIGH (ref <150)

## 2021-01-28 LAB — HEMOGLOBIN A1C
Hgb A1c MFr Bld: 4.8 % of total Hgb (ref <5.7)
Mean Plasma Glucose: 91 mg/dL
eAG (mmol/L): 5 mmol/L

## 2021-01-28 LAB — HEPATITIS C ANTIBODY
Hepatitis C Ab: NONREACTIVE
SIGNAL TO CUT-OFF: 0 (ref <1.00)

## 2021-03-15 ENCOUNTER — Encounter (HOSPITAL_COMMUNITY): Payer: Self-pay | Admitting: *Deleted

## 2021-06-04 ENCOUNTER — Other Ambulatory Visit (HOSPITAL_COMMUNITY): Payer: Self-pay

## 2021-06-04 ENCOUNTER — Other Ambulatory Visit: Payer: Self-pay | Admitting: Internal Medicine

## 2021-06-04 DIAGNOSIS — K219 Gastro-esophageal reflux disease without esophagitis: Secondary | ICD-10-CM

## 2021-06-04 MED ORDER — PANTOPRAZOLE SODIUM 20 MG PO TBEC
DELAYED_RELEASE_TABLET | Freq: Every day | ORAL | 1 refills | Status: DC
  Filled 2021-06-04: qty 90, 90d supply, fill #0
  Filled 2021-10-17: qty 90, 90d supply, fill #1

## 2021-06-04 MED FILL — Sertraline HCl Tab 100 MG: ORAL | 90 days supply | Qty: 90 | Fill #1 | Status: AC

## 2021-06-04 NOTE — Telephone Encounter (Signed)
Requested Prescriptions  Pending Prescriptions Disp Refills   pantoprazole (PROTONIX) 20 MG tablet 90 tablet 1    Sig: TAKE 1 TABLET BY MOUTH ONCE DAILY     Gastroenterology: Proton Pump Inhibitors Passed - 06/04/2021  2:28 PM      Passed - Valid encounter within last 12 months    Recent Outpatient Visits          4 months ago Encounter for general adult medical examination with abnormal findings   [facility], Salvadore Oxford, NP

## 2021-06-05 ENCOUNTER — Other Ambulatory Visit (HOSPITAL_COMMUNITY): Payer: Self-pay

## 2021-10-17 ENCOUNTER — Other Ambulatory Visit (HOSPITAL_COMMUNITY): Payer: Self-pay

## 2021-10-17 MED FILL — Sertraline HCl Tab 100 MG: ORAL | 90 days supply | Qty: 90 | Fill #2 | Status: AC

## 2021-12-16 ENCOUNTER — Encounter: Payer: No Typology Code available for payment source | Admitting: Internal Medicine

## 2021-12-20 ENCOUNTER — Encounter: Payer: No Typology Code available for payment source | Admitting: Internal Medicine

## 2022-01-20 ENCOUNTER — Other Ambulatory Visit (HOSPITAL_COMMUNITY): Payer: Self-pay

## 2022-03-28 ENCOUNTER — Encounter (HOSPITAL_COMMUNITY): Payer: Self-pay | Admitting: *Deleted

## 2022-04-04 ENCOUNTER — Other Ambulatory Visit (HOSPITAL_COMMUNITY): Payer: Self-pay

## 2022-05-02 ENCOUNTER — Encounter: Payer: Self-pay | Admitting: Internal Medicine

## 2022-05-02 ENCOUNTER — Ambulatory Visit (INDEPENDENT_AMBULATORY_CARE_PROVIDER_SITE_OTHER): Payer: 59 | Admitting: Internal Medicine

## 2022-05-02 VITALS — BP 104/60 | HR 68 | Temp 96.9°F | Ht 65.0 in | Wt 150.0 lb

## 2022-05-02 DIAGNOSIS — R7309 Other abnormal glucose: Secondary | ICD-10-CM

## 2022-05-02 DIAGNOSIS — Z9884 Bariatric surgery status: Secondary | ICD-10-CM | POA: Diagnosis not present

## 2022-05-02 DIAGNOSIS — L908 Other atrophic disorders of skin: Secondary | ICD-10-CM | POA: Diagnosis not present

## 2022-05-02 DIAGNOSIS — Z0001 Encounter for general adult medical examination with abnormal findings: Secondary | ICD-10-CM | POA: Diagnosis not present

## 2022-05-02 DIAGNOSIS — Z8349 Family history of other endocrine, nutritional and metabolic diseases: Secondary | ICD-10-CM | POA: Diagnosis not present

## 2022-05-02 NOTE — Patient Instructions (Signed)

## 2022-05-02 NOTE — Progress Notes (Signed)
Subjective:    Patient ID: Robin Little, female    DOB: Nov 25, 1985, 37 y.o.   MRN: [id number]  HPI  Patient presents to clinic today for annual exam.  Flu: 12/2021 Tetanus: 06/2014 COVID: Pfizer x 2 Pap smear: < 5 years ago, [facility] Dentist: biannually  Diet: She does eat meat. She consumes fruits and veggies. She tries to avoid fried foods. She drinks mostly water, coffee. Exercise: None  Review of Systems     Past Medical History:  Diagnosis Date   Anemia    thrombocytopenia was when pregnant   Anxiety    Depression    GERD (gastroesophageal reflux disease)    Headache    migraines   History of shingles    Hyperlipidemia    IBS (irritable bowel syndrome)    Obesity    Pneumonia    as a kid    Current Outpatient Medications  Medication Sig Dispense Refill   calcium carbonate (TUMS - DOSED IN MG ELEMENTAL CALCIUM) 500 MG chewable tablet Chew 1 tablet by mouth daily.     Multiple Vitamins-Minerals (WOMENS MULTIVITAMIN PO) Take 1 tablet by mouth in the morning and at bedtime.     pantoprazole (PROTONIX) 20 MG tablet TAKE 1 TABLET BY MOUTH ONCE DAILY 90 tablet 1   sertraline (ZOLOFT) 100 MG tablet TAKE 1 TABLET BY MOUTH ONCE DAILY 90 tablet 3   No current facility-administered medications for this visit.    No Known Allergies  Family History  Problem Relation Age of Onset   Hyperlipidemia Mother    Hypertension Mother    Arthritis Mother    Hypothyroidism Mother    Hyperlipidemia Father    Hypertension Father    Cancer Maternal Grandmother        melanoma   Goiter Maternal Grandmother    Cancer Paternal Grandmother        liver   Diabetes Maternal Grandfather    Parkinson's disease Maternal Grandfather    Hyperlipidemia Maternal Grandfather    Hypertension Maternal Grandfather    Alcohol abuse Paternal Grandfather     Social History   Socioeconomic History   Marital status: Married    Spouse name: Not on file   Number of  children: 2   Years of education: Not on file   Highest education level: Master's degree (e.g., MA, MS, MEng, MEd, MSW, MBA)  Occupational History   Not on file  Tobacco Use   Smoking status: Never   Smokeless tobacco: Never  Vaping Use   Vaping Use: Never used  Substance and Sexual Activity   Alcohol use: Yes    Comment: social   Drug use: No   Sexual activity: Yes    Birth control/protection: None    Comment: Husband got vasectomy  Other Topics Concern   Not on file  Social History Narrative   Not on file   Social Determinants of Health   Financial Resource Strain: Not on file  Food Insecurity: Not on file  Transportation Needs: Not on file  Physical Activity: Not on file  Stress: Not on file  Social Connections: Not on file  Intimate Partner Violence: Not on file     Constitutional: Patient reports frequent headaches.  Denies fever, malaise, fatigue, or abrupt weight changes.  HEENT: Denies eye pain, eye redness, ear pain, ringing in the ears, wax buildup, runny nose, nasal congestion, bloody nose, or sore throat. Respiratory: Denies difficulty breathing, shortness of breath, cough or sputum production.  Cardiovascular: Denies chest pain, chest tightness, palpitations or swelling in the hands or feet.  Gastrointestinal: Denies abdominal pain, bloating, constipation, diarrhea or blood in the stool.  GU: Denies urgency, frequency, pain with urination, burning sensation, blood in urine, odor or discharge. Musculoskeletal: Denies decrease in range of motion, difficulty with gait, muscle pain or joint pain and swelling.  Skin: Denies redness, rashes, lesions or ulcercations.  Neurological: Denies dizziness, difficulty with memory, difficulty with speech or problems with balance and coordination.  Psych: Patient has a history of anxiety.  Denies depression, SI/HI.  No other specific complaints in a complete review of systems (except as listed in HPI above).  Objective:    Physical Exam  BP 104/60 (BP Location: Right Arm, Patient Position: Sitting, Cuff Size: Normal)   Pulse 68   Temp (!) 96.9 F (36.1 C) (Temporal)   Ht 5\' 5"  (1.651 m)   Wt 150 lb (68 kg)   SpO2 95%   BMI 24.96 kg/m   Wt Readings from Last 3 Encounters:  01/25/21 158 lb 6.4 oz (71.8 kg)  06/18/20 155 lb 12.8 oz (70.7 kg)  03/07/20 163 lb 9.6 oz (74.2 kg)    General: Appears her stated age, well developed, well nourished in NAD. Skin: Warm, dry and intact.  HEENT: Head: normal shape and size; Eyes: sclera white, no icterus, conjunctiva pink, PERRLA and EOMs intact;  Neck:  Neck supple, trachea midline. No masses, lumps or thyromegaly present.  Cardiovascular: Normal rate and rhythm. S1,S2 noted.  No murmur, rubs or gallops noted. No JVD or BLE edema. Pulmonary/Chest: Normal effort and positive vesicular breath sounds. No respiratory distress. No wheezes, rales or ronchi noted.  Abdomen: Normal bowel sounds. Musculoskeletal: Strength 5/5 BUE/BLE. No difficulty with gait.  Neurological: Alert and oriented. Cranial nerves II-XII grossly intact. Coordination normal.  Psychiatric: Mood and affect normal. Behavior is normal. Judgment and thought content normal.   BMET    Component Value Date/Time   NA 138 01/25/2021 1414   K 4.5 01/25/2021 1414   CL 101 01/25/2021 1414   CO2 28 01/25/2021 1414   GLUCOSE 78 01/25/2021 1414   BUN 17 01/25/2021 1414   CREATININE 0.68 01/25/2021 1414   CALCIUM 9.2 01/25/2021 1414   GFRNONAA >60 08/30/2019 0457   GFRAA >60 08/30/2019 0457    Lipid Panel     Component Value Date/Time   CHOL 204 (H) 01/25/2021 1414   TRIG 165 (H) 01/25/2021 1414   HDL 60 01/25/2021 1414   CHOLHDL 3.4 01/25/2021 1414   VLDL 29.8 10/06/2018 0848   LDLCALC 116 (H) 01/25/2021 1414    CBC    Component Value Date/Time   WBC 6.3 01/25/2021 1414   RBC 4.39 01/25/2021 1414   HGB 13.8 01/25/2021 1414   HCT 41.0 01/25/2021 1414   PLT 155 01/25/2021 1414   MCV  93.4 01/25/2021 1414   MCH 31.4 01/25/2021 1414   MCHC 33.7 01/25/2021 1414   RDW 12.1 01/25/2021 1414   LYMPHSABS 0.9 08/30/2019 0457   MONOABS 0.6 08/30/2019 0457   EOSABS 0.0 08/30/2019 0457   BASOSABS 0.0 08/30/2019 0457    Hgb A1C Lab Results  Component Value Date   HGBA1C 4.8 01/25/2021            Assessment & Plan:   Preventative Health Maintenance:  Flu shot UTD Tetanus UTD Encouraged her to get her COVID booster Pap smear UTD, will request copy Encouraged her consume a balanced diet and exercise regimen Advised  her to see a dentist annually We will check CBC, c-Met, lipid and A1c today  Family History of Thyroid Disease:  Will check TSH, free T4, TPO antibodies and thyroglobulin level  S/p Gastric Sleeve:  Will check vitamin D, B12, folate and iron panel  RTC in 1 year for your annual exam Webb Silversmith, NP

## 2022-05-05 LAB — TSH: TSH: 1.53 mIU/L

## 2022-05-05 LAB — LIPID PANEL
Cholesterol: 170 mg/dL (ref <200)
HDL: 66 mg/dL (ref >=50)
LDL Cholesterol (Calc): 87 mg/dL (calc)
Non-HDL Cholesterol (Calc): 104 mg/dL (calc) (ref <130)
Total CHOL/HDL Ratio: 2.6 (calc) (ref <5.0)
Triglycerides: 76 mg/dL (ref <150)

## 2022-05-05 LAB — IRON,TIBC AND FERRITIN PANEL
%SAT: 26 % (calc) (ref 16–45)
Ferritin: 54 ng/mL (ref 16–154)
Iron: 81 ug/dL (ref 40–190)
TIBC: 315 mcg/dL (calc) (ref 250–450)

## 2022-05-05 LAB — CBC
HCT: 38.9 % (ref 35.0–45.0)
Hemoglobin: 13.3 g/dL (ref 11.7–15.5)
MCH: 31.4 pg (ref 27.0–33.0)
MCHC: 34.2 g/dL (ref 32.0–36.0)
MCV: 91.7 fL (ref 80.0–100.0)
Platelets: 151 10*3/uL (ref 140–400)
RBC: 4.24 10*6/uL (ref 3.80–5.10)
RDW: 12.4 % (ref 11.0–15.0)
WBC: 5.4 10*3/uL (ref 3.8–10.8)

## 2022-05-05 LAB — HEMOGLOBIN A1C
Hgb A1c MFr Bld: 4.9 % of total Hgb (ref <5.7)
Mean Plasma Glucose: 94 mg/dL
eAG (mmol/L): 5.2 mmol/L

## 2022-05-05 LAB — THYROGLOBULIN LEVEL: Thyroglobulin: 5.4 ng/mL

## 2022-05-05 LAB — COMPLETE METABOLIC PANEL WITH GFR
AG Ratio: 1.7 (calc) (ref 1.0–2.5)
ALT: 7 U/L (ref 6–29)
AST: 13 U/L (ref 10–30)
Albumin: 4.5 g/dL (ref 3.6–5.1)
Alkaline phosphatase (APISO): 49 U/L (ref 31–125)
BUN: 10 mg/dL (ref 7–25)
CO2: 26 mmol/L (ref 20–32)
Calcium: 8.8 mg/dL (ref 8.6–10.2)
Chloride: 104 mmol/L (ref 98–110)
Creat: 0.77 mg/dL (ref 0.50–0.97)
Globulin: 2.7 g/dL (calc) (ref 1.9–3.7)
Glucose, Bld: 82 mg/dL (ref 65–99)
Potassium: 4.2 mmol/L (ref 3.5–5.3)
Sodium: 140 mmol/L (ref 135–146)
Total Bilirubin: 0.4 mg/dL (ref 0.2–1.2)
Total Protein: 7.2 g/dL (ref 6.1–8.1)
eGFR: 102 mL/min/{1.73_m2} (ref >=60)

## 2022-05-05 LAB — T4, FREE: Free T4: 1 ng/dL (ref 0.8–1.8)

## 2022-05-05 LAB — FOLATE: Folate: 12.9 ng/mL

## 2022-05-05 LAB — VITAMIN B12: Vitamin B-12: 312 pg/mL (ref 200–1100)

## 2022-05-05 LAB — THYROID PEROXIDASE ANTIBODIES (TPO) (REFL): Thyroperoxidase Ab SerPl-aCnc: <1 IU/mL (ref <9)

## 2022-05-05 LAB — VITAMIN D 25 HYDROXY (VIT D DEFICIENCY, FRACTURES): Vit D, 25-Hydroxy: 35 ng/mL (ref 30–100)

## 2022-05-08 DIAGNOSIS — H52223 Regular astigmatism, bilateral: Secondary | ICD-10-CM | POA: Diagnosis not present

## 2022-05-08 DIAGNOSIS — H04123 Dry eye syndrome of bilateral lacrimal glands: Secondary | ICD-10-CM | POA: Diagnosis not present

## 2022-05-29 ENCOUNTER — Encounter: Payer: Self-pay | Admitting: Internal Medicine

## 2022-06-25 DIAGNOSIS — Z1283 Encounter for screening for malignant neoplasm of skin: Secondary | ICD-10-CM | POA: Diagnosis not present

## 2022-06-25 DIAGNOSIS — D225 Melanocytic nevi of trunk: Secondary | ICD-10-CM | POA: Diagnosis not present

## 2022-06-25 DIAGNOSIS — D485 Neoplasm of uncertain behavior of skin: Secondary | ICD-10-CM | POA: Diagnosis not present

## 2022-08-11 DIAGNOSIS — Z01419 Encounter for gynecological examination (general) (routine) without abnormal findings: Secondary | ICD-10-CM | POA: Diagnosis not present

## 2022-08-11 DIAGNOSIS — Z6824 Body mass index (BMI) 24.0-24.9, adult: Secondary | ICD-10-CM | POA: Diagnosis not present

## 2022-08-13 LAB — HM PAP SMEAR
HM Pap smear: ABNORMAL
HPV, high-risk: NEGATIVE

## 2022-11-05 ENCOUNTER — Other Ambulatory Visit: Payer: Self-pay | Admitting: Oncology

## 2022-11-05 DIAGNOSIS — Z006 Encounter for examination for normal comparison and control in clinical research program: Secondary | ICD-10-CM

## 2023-04-08 ENCOUNTER — Encounter (HOSPITAL_COMMUNITY): Payer: Self-pay | Admitting: *Deleted

## 2023-12-11 ENCOUNTER — Ambulatory Visit (INDEPENDENT_AMBULATORY_CARE_PROVIDER_SITE_OTHER): Admitting: Internal Medicine

## 2023-12-11 ENCOUNTER — Encounter: Payer: Self-pay | Admitting: Internal Medicine

## 2023-12-11 VITALS — BP 102/68 | Ht 65.0 in | Wt 163.6 lb

## 2023-12-11 DIAGNOSIS — R739 Hyperglycemia, unspecified: Secondary | ICD-10-CM

## 2023-12-11 DIAGNOSIS — E663 Overweight: Secondary | ICD-10-CM

## 2023-12-11 DIAGNOSIS — Z0001 Encounter for general adult medical examination with abnormal findings: Secondary | ICD-10-CM

## 2023-12-11 DIAGNOSIS — Z6827 Body mass index (BMI) 27.0-27.9, adult: Secondary | ICD-10-CM | POA: Insufficient documentation

## 2023-12-11 DIAGNOSIS — Z8349 Family history of other endocrine, nutritional and metabolic diseases: Secondary | ICD-10-CM | POA: Diagnosis not present

## 2023-12-11 DIAGNOSIS — Z136 Encounter for screening for cardiovascular disorders: Secondary | ICD-10-CM

## 2023-12-11 NOTE — Patient Instructions (Signed)

## 2023-12-11 NOTE — Progress Notes (Signed)
 Subjective:    Patient ID: Robin Little, female    DOB: [birth year], 38 y.o.   MRN: [id number]  HPI  Patient presents to clinic today for annual exam.  Flu: 12/2022 Tetanus: 06/2014 COVID: Pfizer x 2 Pap smear: < 5 years ago, [facility] [employer] Dentist: biannually  Diet: She does eat meat. She consumes fruits and veggies. She tries to avoid fried foods. She drinks mostly water, coffee. Exercise: None  Review of Systems     Past Medical History:  Diagnosis Date  . Anemia    thrombocytopenia was when pregnant  . Anxiety   . Depression   . GERD (gastroesophageal reflux disease)   . Headache    migraines  . History of shingles   . Hyperlipidemia   . IBS (irritable bowel syndrome)   . Obesity   . Pneumonia    as a kid    Current Outpatient Medications  Medication Sig Dispense Refill  . calcium carbonate (TUMS - DOSED IN MG ELEMENTAL CALCIUM) 500 MG chewable tablet Chew 1 tablet by mouth daily.    . Multiple Vitamins-Minerals (WOMENS MULTIVITAMIN PO) Take 1 tablet by mouth in the morning and at bedtime.     No current facility-administered medications for this visit.    No Known Allergies  Family History  Problem Relation Age of Onset  . Hyperlipidemia Mother   . Hypertension Mother   . Arthritis Mother   . Hypothyroidism Mother   . Hyperlipidemia Father   . Hypertension Father   . Pulmonary fibrosis Father   . Melanoma Maternal Grandmother   . Goiter Maternal Grandmother   . Diabetes Maternal Grandfather   . Parkinson's disease Maternal Grandfather   . Hyperlipidemia Maternal Grandfather   . Hypertension Maternal Grandfather   . Liver cancer Paternal Grandmother   . Alcohol abuse Paternal Grandfather     Social History   Socioeconomic History  . Marital status: Married    Spouse name: Not on file  . Number of children: 2  . Years of education: Not on file  . Highest education level: Master's degree (e.g., MA, MS, MEng, MEd, MSW, MBA)   Occupational History  . Not on file  Tobacco Use  . Smoking status: Never  . Smokeless tobacco: Never  Vaping Use  . Vaping status: Never Used  Substance and Sexual Activity  . Alcohol use: Yes    Comment: social  . Drug use: No  . Sexual activity: Yes    Birth control/protection: None    Comment: Husband got vasectomy  Other Topics Concern  . Not on file  Social History Narrative  . Not on file   Social Drivers of Health   Financial Resource Strain: Low Risk  (12/08/2023)   Overall Financial Resource Strain (CARDIA)   . Difficulty of Paying Living Expenses: Not hard at all  Food Insecurity: No Food Insecurity (12/08/2023)   Hunger Vital Sign   . Worried About Programme researcher, broadcasting/film/video in the Last Year: Never true   . Ran Out of Food in the Last Year: Never true  Transportation Needs: No Transportation Needs (12/08/2023)   PRAPARE - Transportation   . Lack of Transportation (Medical): No   . Lack of Transportation (Non-Medical): No  Physical Activity: Insufficiently Active (12/08/2023)   Exercise Vital Sign   . Days of Exercise per Week: 2 days   . Minutes of Exercise per Session: 60 min  Stress: No Stress Concern Present (12/08/2023)   [place]  [employer] - Occupational Stress Questionnaire   . Feeling of Stress: Not at all  Social Connections: Moderately Integrated (12/08/2023)   Social Connection and Isolation Panel   . Frequency of Communication with Friends and Family: More than three times a week   . Frequency of Social Gatherings with Friends and Family: Three times a week   . Attends Religious Services: 1 to 4 times per year   . Active Member of Clubs or Organizations: No   . Attends Banker Meetings: Not on file   . Marital Status: Married  Catering manager Violence: Not on file     Constitutional: Patient reports intermittent headaches.  Denies fever, malaise, fatigue, or abrupt weight changes.  HEENT: Denies eye pain, eye redness,  ear pain, ringing in the ears, wax buildup, runny nose, nasal congestion, bloody nose, or sore throat. Respiratory: Denies difficulty breathing, shortness of breath, cough or sputum production.   Cardiovascular: Denies chest pain, chest tightness, palpitations or swelling in the hands or feet.  Gastrointestinal: Denies abdominal pain, bloating, constipation, diarrhea or blood in the stool.  GU: Denies urgency, frequency, pain with urination, burning sensation, blood in urine, odor or discharge. Musculoskeletal: Denies decrease in range of motion, difficulty with gait, muscle pain or joint pain and swelling.  Skin: Denies redness, rashes, lesions or ulcercations.  Neurological: Denies dizziness, difficulty with memory, difficulty with speech or problems with balance and coordination.  Psych: Patient has a history of anxiety.  Denies depression, SI/HI.  No other specific complaints in a complete review of systems (except as listed in HPI above).  Objective:   Physical Exam  BP 102/68 (BP Location: Left Arm, Patient Position: Sitting, Cuff Size: Normal)   Ht 5' 5 (1.651 m)   Wt 163 lb 9.6 oz (74.2 kg)   LMP 12/06/2023 (Exact Date)   BMI 27.22 kg/m    Wt Readings from Last 3 Encounters:  05/02/22 150 lb (68 kg)  01/25/21 158 lb 6.4 oz (71.8 kg)  06/18/20 155 lb 12.8 oz (70.7 kg)    General: Appears her stated age, overweight, in NAD. Skin: Warm, dry and intact.  HEENT: Head: normal shape and size; Eyes: sclera white, no icterus, conjunctiva pink, PERRLA and EOMs intact;  Neck:  Neck supple, trachea midline. No masses, lumps or thyromegaly present.  Cardiovascular: Normal rate and rhythm. S1,S2 noted.  No murmur, rubs or gallops noted. No JVD or BLE edema. Pulmonary/Chest: Normal effort and positive vesicular breath sounds. No respiratory distress. No wheezes, rales or ronchi noted.  Abdomen: Normal bowel sounds. Musculoskeletal: Strength 5/5 BUE/BLE. No difficulty with gait.   Neurological: Alert and oriented. Cranial nerves II-XII grossly intact. Coordination normal.  Psychiatric: Mood and affect normal. Behavior is normal. Judgment and thought content normal.   BMET    Component Value Date/Time   NA 140 05/02/2022 1335   K 4.2 05/02/2022 1335   CL 104 05/02/2022 1335   CO2 26 05/02/2022 1335   GLUCOSE 82 05/02/2022 1335   BUN 10 05/02/2022 1335   CREATININE 0.77 05/02/2022 1335   CALCIUM 8.8 05/02/2022 1335   GFRNONAA >60 08/30/2019 0457   GFRAA >60 08/30/2019 0457    Lipid Panel     Component Value Date/Time   CHOL 170 05/02/2022 1335   TRIG 76 05/02/2022 1335   HDL 66 05/02/2022 1335   CHOLHDL 2.6 05/02/2022 1335   VLDL 29.8 10/06/2018 0848   LDLCALC 87 05/02/2022 1335    CBC  Component Value Date/Time   WBC 5.4 05/02/2022 1335   RBC 4.24 05/02/2022 1335   HGB 13.3 05/02/2022 1335   HCT 38.9 05/02/2022 1335   PLT 151 05/02/2022 1335   MCV 91.7 05/02/2022 1335   MCH 31.4 05/02/2022 1335   MCHC 34.2 05/02/2022 1335   RDW 12.4 05/02/2022 1335   LYMPHSABS 0.9 08/30/2019 0457   MONOABS 0.6 08/30/2019 0457   EOSABS 0.0 08/30/2019 0457   BASOSABS 0.0 08/30/2019 0457    Hgb A1C Lab Results  Component Value Date   HGBA1C 4.9 05/02/2022            Assessment & Plan:   Preventative Health Maintenance:  Encouraged to get a flu shot in the fall Tetanus UTD Encouraged her to get her COVID booster Pap smear UTD, will request copy Encouraged her consume a balanced diet and exercise regimen Advised her to see a dentist annually We will check CBC, c-Met, lipid and A1c today   RTC in 1 year for your annual exam Angeline Laura, NP

## 2023-12-11 NOTE — Assessment & Plan Note (Signed)
 Encouraged diet and exercise for weight loss ?

## 2023-12-12 ENCOUNTER — Ambulatory Visit: Payer: Self-pay | Admitting: Internal Medicine

## 2023-12-12 LAB — COMPREHENSIVE METABOLIC PANEL WITH GFR
AG Ratio: 1.8 (calc) (ref 1.0–2.5)
ALT: 10 U/L (ref 6–29)
AST: 13 U/L (ref 10–30)
Albumin: 4.1 g/dL (ref 3.6–5.1)
Alkaline phosphatase (APISO): 43 U/L (ref 31–125)
BUN: 10 mg/dL (ref 7–25)
CO2: 25 mmol/L (ref 20–32)
Calcium: 8.4 mg/dL — ABNORMAL LOW (ref 8.6–10.2)
Chloride: 105 mmol/L (ref 98–110)
Creat: 0.8 mg/dL (ref 0.50–0.97)
Globulin: 2.3 g/dL (ref 1.9–3.7)
Glucose, Bld: 78 mg/dL (ref 65–99)
Potassium: 4.1 mmol/L (ref 3.5–5.3)
Sodium: 139 mmol/L (ref 135–146)
Total Bilirubin: 0.4 mg/dL (ref 0.2–1.2)
Total Protein: 6.4 g/dL (ref 6.1–8.1)
eGFR: 97 mL/min/1.73m2 (ref >=60)

## 2023-12-12 LAB — CBC
HCT: 38.5 % (ref 35.0–45.0)
Hemoglobin: 12.6 g/dL (ref 11.7–15.5)
MCH: 31.1 pg (ref 27.0–33.0)
MCHC: 32.7 g/dL (ref 32.0–36.0)
MCV: 95.1 fL (ref 80.0–100.0)
MPV: 14.5 fL — ABNORMAL HIGH (ref 7.5–12.5)
Platelets: 133 Thousand/uL — ABNORMAL LOW (ref 140–400)
RBC: 4.05 Million/uL (ref 3.80–5.10)
RDW: 12.5 % (ref 11.0–15.0)
WBC: 5.4 Thousand/uL (ref 3.8–10.8)

## 2023-12-12 LAB — LIPID PANEL
Cholesterol: 171 mg/dL (ref <200)
HDL: 72 mg/dL (ref >=50)
LDL Cholesterol (Calc): 85 mg/dL
Non-HDL Cholesterol (Calc): 99 mg/dL (ref <130)
Total CHOL/HDL Ratio: 2.4 (calc) (ref <5.0)
Triglycerides: 59 mg/dL (ref <150)

## 2023-12-12 LAB — HEMOGLOBIN A1C
Hgb A1c MFr Bld: 5 % (ref <5.7)
Mean Plasma Glucose: 97 mg/dL
eAG (mmol/L): 5.4 mmol/L

## 2023-12-12 LAB — TSH: TSH: 1.46 m[IU]/L

## 2024-02-09 ENCOUNTER — Other Ambulatory Visit: Payer: Self-pay | Admitting: Medical Genetics

## 2024-02-09 DIAGNOSIS — Z006 Encounter for examination for normal comparison and control in clinical research program: Secondary | ICD-10-CM

## 2024-04-08 DIAGNOSIS — H52223 Regular astigmatism, bilateral: Secondary | ICD-10-CM | POA: Diagnosis not present
